# Patient Record
Sex: Male | Born: 1960 | Race: White | Hispanic: No | Marital: Married | Smoking: Never smoker
Health system: Southern US, Community
[De-identification: ages and names within clinical notes are randomized; demographics above are authoritative.]

## PROBLEM LIST (undated history)

## (undated) DIAGNOSIS — I10 Essential (primary) hypertension: Secondary | ICD-10-CM

## (undated) DIAGNOSIS — R0609 Other forms of dyspnea: Secondary | ICD-10-CM

## (undated) DIAGNOSIS — K429 Umbilical hernia without obstruction or gangrene: Secondary | ICD-10-CM

## (undated) DIAGNOSIS — R0600 Dyspnea, unspecified: Secondary | ICD-10-CM

## (undated) DIAGNOSIS — D759 Disease of blood and blood-forming organs, unspecified: Secondary | ICD-10-CM

## (undated) DIAGNOSIS — J45909 Unspecified asthma, uncomplicated: Secondary | ICD-10-CM

## (undated) DIAGNOSIS — R51 Headache: Secondary | ICD-10-CM

## (undated) DIAGNOSIS — E291 Testicular hypofunction: Secondary | ICD-10-CM

## (undated) DIAGNOSIS — I38 Endocarditis, valve unspecified: Secondary | ICD-10-CM

## (undated) DIAGNOSIS — R609 Edema, unspecified: Secondary | ICD-10-CM

## (undated) DIAGNOSIS — C799 Secondary malignant neoplasm of unspecified site: Secondary | ICD-10-CM

## (undated) DIAGNOSIS — K219 Gastro-esophageal reflux disease without esophagitis: Secondary | ICD-10-CM

## (undated) DIAGNOSIS — E119 Type 2 diabetes mellitus without complications: Secondary | ICD-10-CM

## (undated) DIAGNOSIS — G4730 Sleep apnea, unspecified: Secondary | ICD-10-CM

## (undated) DIAGNOSIS — R600 Localized edema: Secondary | ICD-10-CM

## (undated) DIAGNOSIS — R519 Headache, unspecified: Secondary | ICD-10-CM

## (undated) HISTORY — PX: KNEE SURGERY: SHX244

## (undated) HISTORY — DX: Essential (primary) hypertension: I10

## (undated) HISTORY — DX: Type 2 diabetes mellitus without complications: E11.9

## (undated) HISTORY — PX: TONSILLECTOMY: SUR1361

---

## 2004-08-28 DIAGNOSIS — C799 Secondary malignant neoplasm of unspecified site: Secondary | ICD-10-CM

## 2004-08-28 HISTORY — DX: Secondary malignant neoplasm of unspecified site: C79.9

## 2005-04-02 ENCOUNTER — Emergency Department: Payer: Self-pay | Admitting: Emergency Medicine

## 2006-04-06 ENCOUNTER — Emergency Department: Payer: Self-pay | Admitting: Emergency Medicine

## 2007-04-07 ENCOUNTER — Ambulatory Visit: Payer: Self-pay | Admitting: Internal Medicine

## 2008-04-08 ENCOUNTER — Ambulatory Visit: Payer: Self-pay | Admitting: Family Medicine

## 2008-12-17 ENCOUNTER — Emergency Department: Payer: Self-pay | Admitting: Emergency Medicine

## 2009-08-28 HISTORY — PX: COLONOSCOPY: SHX174

## 2010-06-01 ENCOUNTER — Ambulatory Visit: Payer: Self-pay | Admitting: Cardiovascular Disease

## 2010-07-29 ENCOUNTER — Emergency Department: Payer: Self-pay | Admitting: Unknown Physician Specialty

## 2010-10-10 ENCOUNTER — Observation Stay: Payer: Self-pay | Admitting: Internal Medicine

## 2012-03-22 ENCOUNTER — Emergency Department: Payer: Self-pay | Admitting: Emergency Medicine

## 2012-03-22 LAB — BASIC METABOLIC PANEL
Anion Gap: 7 (ref 7–16)
Calcium, Total: 9.1 mg/dL (ref 8.5–10.1)
Chloride: 103 mmol/L (ref 98–107)
Co2: 31 mmol/L (ref 21–32)
EGFR (Non-African Amer.): 52 — ABNORMAL LOW
Glucose: 111 mg/dL — ABNORMAL HIGH (ref 65–99)
Potassium: 3.8 mmol/L (ref 3.5–5.1)
Sodium: 141 mmol/L (ref 136–145)

## 2012-03-22 LAB — CBC
HGB: 15.4 g/dL (ref 13.0–18.0)
MCH: 29.1 pg (ref 26.0–34.0)
MCHC: 33.4 g/dL (ref 32.0–36.0)
MCV: 87 fL (ref 80–100)
RDW: 13.4 % (ref 11.5–14.5)

## 2012-03-22 LAB — CK TOTAL AND CKMB (NOT AT ARMC): CK-MB: 1 ng/mL (ref 0.5–3.6)

## 2012-03-23 LAB — CK TOTAL AND CKMB (NOT AT ARMC)
CK, Total: 46 U/L (ref 35–232)
CK-MB: 0.9 ng/mL (ref 0.5–3.6)

## 2014-05-08 ENCOUNTER — Emergency Department: Payer: Self-pay | Admitting: Emergency Medicine

## 2014-05-08 LAB — CBC WITH DIFFERENTIAL/PLATELET
Basophil #: 0 10*3/uL (ref 0.0–0.1)
Basophil %: 0.4 %
Eosinophil #: 0.2 10*3/uL (ref 0.0–0.7)
Eosinophil %: 2.1 %
HCT: 51.1 % (ref 40.0–52.0)
HGB: 16.2 g/dL (ref 13.0–18.0)
LYMPHS ABS: 0.7 10*3/uL — AB (ref 1.0–3.6)
Lymphocyte %: 9.6 %
MCH: 27.7 pg (ref 26.0–34.0)
MCHC: 31.8 g/dL — AB (ref 32.0–36.0)
MCV: 87 fL (ref 80–100)
Monocyte #: 0.4 x10 3/mm (ref 0.2–1.0)
Monocyte %: 5.5 %
Neutrophil #: 6.3 10*3/uL (ref 1.4–6.5)
Neutrophil %: 82.4 %
Platelet: 233 10*3/uL (ref 150–440)
RBC: 5.85 10*6/uL (ref 4.40–5.90)
RDW: 13.6 % (ref 11.5–14.5)
WBC: 7.6 10*3/uL (ref 3.8–10.6)

## 2014-05-08 LAB — COMPREHENSIVE METABOLIC PANEL
Albumin: 3.6 g/dL (ref 3.4–5.0)
Alkaline Phosphatase: 44 U/L — ABNORMAL LOW
Anion Gap: 8 (ref 7–16)
BILIRUBIN TOTAL: 0.8 mg/dL (ref 0.2–1.0)
BUN: 15 mg/dL (ref 7–18)
CALCIUM: 8.5 mg/dL (ref 8.5–10.1)
CHLORIDE: 105 mmol/L (ref 98–107)
Co2: 25 mmol/L (ref 21–32)
Creatinine: 1.36 mg/dL — ABNORMAL HIGH (ref 0.60–1.30)
GFR CALC NON AF AMER: 59 — AB
GLUCOSE: 125 mg/dL — AB (ref 65–99)
OSMOLALITY: 278 (ref 275–301)
Potassium: 3.8 mmol/L (ref 3.5–5.1)
SGOT(AST): 28 U/L (ref 15–37)
SGPT (ALT): 31 U/L
SODIUM: 138 mmol/L (ref 136–145)
Total Protein: 7.4 g/dL (ref 6.4–8.2)

## 2014-05-08 LAB — URINALYSIS, COMPLETE
BLOOD: NEGATIVE
Bacteria: NONE SEEN
Bilirubin,UR: NEGATIVE
GLUCOSE, UR: NEGATIVE mg/dL (ref 0–75)
KETONE: NEGATIVE
Leukocyte Esterase: NEGATIVE
Nitrite: NEGATIVE
PH: 5 (ref 4.5–8.0)
Protein: NEGATIVE
RBC,UR: <1 /HPF (ref 0–5)
SPECIFIC GRAVITY: 1.012 (ref 1.003–1.030)

## 2014-05-08 LAB — LIPASE, BLOOD: LIPASE: 120 U/L (ref 73–393)

## 2015-02-24 ENCOUNTER — Encounter: Payer: Self-pay | Admitting: Urgent Care

## 2015-02-24 ENCOUNTER — Emergency Department
Admission: EM | Admit: 2015-02-24 | Discharge: 2015-02-25 | Disposition: A | Discharge status: Home / Self Care | Payer: BLUE CROSS/BLUE SHIELD | Attending: Emergency Medicine | Admitting: Emergency Medicine

## 2015-02-24 DIAGNOSIS — R509 Fever, unspecified: Secondary | ICD-10-CM | POA: Diagnosis not present

## 2015-02-24 DIAGNOSIS — E860 Dehydration: Secondary | ICD-10-CM | POA: Insufficient documentation

## 2015-02-24 DIAGNOSIS — R51 Headache: Secondary | ICD-10-CM | POA: Insufficient documentation

## 2015-02-24 DIAGNOSIS — R109 Unspecified abdominal pain: Secondary | ICD-10-CM | POA: Diagnosis not present

## 2015-02-24 DIAGNOSIS — R197 Diarrhea, unspecified: Secondary | ICD-10-CM

## 2015-02-24 HISTORY — DX: Edema, unspecified: R60.9

## 2015-02-24 HISTORY — DX: Secondary malignant neoplasm of unspecified site: C79.9

## 2015-02-24 HISTORY — DX: Umbilical hernia without obstruction or gangrene: K42.9

## 2015-02-24 HISTORY — DX: Localized edema: R60.0

## 2015-02-24 HISTORY — DX: Testicular hypofunction: E29.1

## 2015-02-24 HISTORY — DX: Dyspnea, unspecified: R06.00

## 2015-02-24 HISTORY — DX: Other forms of dyspnea: R06.09

## 2015-02-24 HISTORY — DX: Endocarditis, valve unspecified: I38

## 2015-02-24 LAB — COMPREHENSIVE METABOLIC PANEL
ALT: 19 U/L (ref 17–63)
ANION GAP: 9 (ref 5–15)
AST: 17 U/L (ref 15–41)
Albumin: 4.2 g/dL (ref 3.5–5.0)
Alkaline Phosphatase: 46 U/L (ref 38–126)
BILIRUBIN TOTAL: 0.5 mg/dL (ref 0.3–1.2)
BUN: 14 mg/dL (ref 6–20)
CHLORIDE: 100 mmol/L — AB (ref 101–111)
CO2: 27 mmol/L (ref 22–32)
CREATININE: 1.05 mg/dL (ref 0.61–1.24)
Calcium: 9.3 mg/dL (ref 8.9–10.3)
GLUCOSE: 104 mg/dL — AB (ref 65–99)
Potassium: 4 mmol/L (ref 3.5–5.1)
Sodium: 136 mmol/L (ref 135–145)
Total Protein: 7.4 g/dL (ref 6.5–8.1)

## 2015-02-24 LAB — CBC WITH DIFFERENTIAL/PLATELET
BASOS ABS: 0 10*3/uL (ref 0–0.1)
Basophils Relative: 0 %
Eosinophils Absolute: 0.2 10*3/uL (ref 0–0.7)
Eosinophils Relative: 1 %
HEMATOCRIT: 53.8 % — AB (ref 40.0–52.0)
Hemoglobin: 17.6 g/dL (ref 13.0–18.0)
LYMPHS PCT: 5 %
Lymphs Abs: 0.6 10*3/uL — ABNORMAL LOW (ref 1.0–3.6)
MCH: 28.1 pg (ref 26.0–34.0)
MCHC: 32.7 g/dL (ref 32.0–36.0)
MCV: 86.1 fL (ref 80.0–100.0)
Monocytes Absolute: 0.5 10*3/uL (ref 0.2–1.0)
Monocytes Relative: 4 %
NEUTROS ABS: 10.5 10*3/uL — AB (ref 1.4–6.5)
Neutrophils Relative %: 90 %
PLATELETS: 236 10*3/uL (ref 150–440)
RBC: 6.24 MIL/uL — ABNORMAL HIGH (ref 4.40–5.90)
RDW: 13.6 % (ref 11.5–14.5)
WBC: 11.7 10*3/uL — ABNORMAL HIGH (ref 3.8–10.6)

## 2015-02-24 LAB — LIPASE, BLOOD: LIPASE: 54 U/L — AB (ref 22–51)

## 2015-02-24 MED ORDER — SODIUM CHLORIDE 0.9 % IV BOLUS (SEPSIS): 1000.0000 mL | Freq: Once | INTRAVENOUS | Status: DC

## 2015-02-24 MED ORDER — SODIUM CHLORIDE 0.9 % IV BOLUS (SEPSIS)
1000.0000 mL | Freq: Once | INTRAVENOUS | Status: AC
  Administered 2015-02-24: 1000 mL via INTRAVENOUS

## 2015-02-24 MED ORDER — IOHEXOL 240 MG/ML SOLN
25.0000 mL | Freq: Once | INTRAMUSCULAR | Status: AC | PRN
  Administered 2015-02-25: 25 mL via ORAL

## 2015-02-24 NOTE — ED Notes (Signed)
Patient presents with c/o a non-specific headache, diarrhea, and a fever with chills that began when patient woke up this morning. Reporting 10 episodes of diarrhea.

## 2015-02-24 NOTE — ED Provider Notes (Signed)
[facility] Emergency Department Provider Note  ____________________________________________  Time seen: 11:15 PM  I have reviewed the triage vital signs and the nursing notes.   HISTORY  Chief Complaint Fever; Diarrhea; and Headache      HPI Kevonta Phariss is a 54 y.o. male presents with nonbloody diarrhea"all day". Subjective fevers chills and generalized abdominal discomfort. Patient states "I figured I ate something bad". Patient admits to cessation of the diarrhea since arrival to the emergency department status post taking half a tablet of Imodium.     Past Medical History  Diagnosis Date  . Peripheral edema   . Leaky heart valve   . Dyspnea on exertion   . Umbilical hernia   . 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 Hypogonadism male     There are no active problems to display for this patient.   Past Surgical History  Procedure Laterality Date  . Tonsillectomy    . Knee surgery      No current outpatient prescriptions on file.  Allergies Biaxin and Ibuprofen  No family history on file.  Social History History  Substance Use Topics  . Smoking status: Never Smoker   . Smokeless tobacco: Current User  . Alcohol Use: Yes    Review of Systems  Constitutional: Negative for fever. Eyes: Negative for visual changes. ENT: Negative for sore throat. Cardiovascular: Negative for chest pain. Respiratory: Negative for shortness of breath. Gastrointestinal: Positive for abdominal pain  and diarrhea. Genitourinary: Negative for dysuria. Musculoskeletal: Negative for back pain. Skin: Negative for rash. Neurological: Negative for headaches, focal weakness or numbness.   10-point ROS otherwise negative.  ____________________________________________   PHYSICAL EXAM:  VITAL SIGNS: ED Triage Vitals  Enc Vitals Group     BP 02/24/15 2012 138/84 mmHg     Pulse Rate 02/24/15 2012 110     Resp 02/24/15 2012  20     Temp 02/24/15 2012 99.9 F (37.7 C)     Temp Source 02/24/15 2012 Oral     SpO2 02/24/15 2012 92 %     Weight 02/24/15 2012 293 lb (132.904 kg)     Height 02/24/15 2012 6' (1.829 m)     Head Cir --      Peak Flow --      Pain Score 02/24/15 2013 8     Pain Loc --      Pain Edu? --      Excl. in [place]? --      Constitutional: Alert and oriented. Well appearing and in no distress. Eyes: Conjunctivae are normal. PERRL. Normal extraocular movements. ENT   Head: Normocephalic and atraumatic.   Nose: No congestion/rhinnorhea.   Mouth/Throat: Mucous membranes are moist.   Neck: No stridor. Cardiovascular: Normal rate, regular rhythm. Normal and symmetric distal pulses are present in all extremities. No murmurs, rubs, or gallops. Respiratory: Normal respiratory effort without tachypnea nor retractions. Breath sounds are clear and equal bilaterally. No wheezes/rales/rhonchi. Gastrointestinal: Central abdominal pain with palpation. No distention. There is no CVA tenderness. Genitourinary: deferred Musculoskeletal: Nontender with normal range of motion in all extremities. No joint effusions.  No lower extremity tenderness nor edema. Neurologic:  Normal speech and language. No gross focal neurologic deficits are appreciated. Speech is normal.  Skin:  Skin is warm, dry and intact. No rash noted. Psychiatric: Mood and affect are normal. Speech and behavior are normal. Patient exhibits appropriate insight and judgment.  ____________________________________________    LABS (pertinent  positives/negatives)  Labs Reviewed  CBC WITH DIFFERENTIAL/PLATELET - Abnormal; Notable for the following:    WBC 11.7 (*)    RBC 6.24 (*)    HCT 53.8 (*)    Neutro Abs 10.5 (*)    Lymphs Abs 0.6 (*)    All other components within normal limits  COMPREHENSIVE METABOLIC PANEL - Abnormal; Notable for the following:    Chloride 100 (*)    Glucose, Bld 104 (*)    All other components within  normal limits  LIPASE, BLOOD - Abnormal; Notable for the following:    Lipase 54 (*)    All other components within normal limits       RADIOLOGY  CT scan of the abdomen and pelvis revealed  IMPRESSION: No acute abnormality noted.    INITIAL IMPRESSION / ASSESSMENT AND PLAN / ED COURSE  Pertinent labs & imaging results that were available during my care of the patient were reviewed by me and considered in my medical decision making (see chart for details).  History of physical exam consistent with possible gastroenteritis as such patient received 2 L IV normal saline.  ____________________________________________   FINAL CLINICAL IMPRESSION(S) / ED DIAGNOSES  Final diagnoses:  Diarrhea  Dehydration      Gregor Hams, MD 02/26/15 [phone]

## 2015-02-24 NOTE — ED Notes (Signed)
In to advise patient I would be with him in just a few minutes as I have two discharges pending. Patient being registered currently.

## 2015-02-24 NOTE — ED Notes (Signed)
Patient started drinking contrast dye. CT tech in room with patient and family member explaining procedural items.

## 2015-02-25 ENCOUNTER — Emergency Department: Payer: BLUE CROSS/BLUE SHIELD

## 2015-02-25 ENCOUNTER — Encounter: Payer: Self-pay | Admitting: Radiology

## 2015-02-25 MED ORDER — IOHEXOL 300 MG/ML  SOLN
100.0000 mL | Freq: Once | INTRAMUSCULAR | Status: AC | PRN
  Administered 2015-02-25: 100 mL via INTRAVENOUS

## 2015-02-25 NOTE — ED Notes (Signed)
Patient requesting urinal, given. Will continue to monitor. Denies other needs at this time.

## 2015-02-25 NOTE — ED Notes (Signed)
Patient to CT.

## 2015-02-25 NOTE — ED Notes (Signed)
Patient returned from [place]. States he is hungry and ready to go home.

## 2015-02-25 NOTE — Discharge Instructions (Signed)

## 2015-04-13 ENCOUNTER — Encounter: Payer: Self-pay | Admitting: *Deleted

## 2015-04-22 ENCOUNTER — Encounter: Payer: Self-pay | Admitting: General Surgery

## 2015-04-22 ENCOUNTER — Ambulatory Visit (INDEPENDENT_AMBULATORY_CARE_PROVIDER_SITE_OTHER): Payer: BLUE CROSS/BLUE SHIELD | Admitting: General Surgery

## 2015-04-22 VITALS — BP 118/84 | HR 70 | Resp 12 | Ht 72.0 in | Wt 294.0 lb

## 2015-04-22 DIAGNOSIS — K429 Umbilical hernia without obstruction or gangrene: Secondary | ICD-10-CM | POA: Diagnosis not present

## 2015-04-22 DIAGNOSIS — M6208 Separation of muscle (nontraumatic), other site: Secondary | ICD-10-CM

## 2015-04-22 NOTE — Patient Instructions (Addendum)
The patient is aware to call back for any questions or concerns.  Hernia A hernia occurs when an internal organ pushes out through a weak spot in the abdominal wall. Hernias most commonly occur in the groin and around the navel. Hernias often can be pushed back into place (reduced). Most hernias tend to get worse over time. Some abdominal hernias can get stuck in the opening (irreducible or incarcerated hernia) and cannot be reduced. An irreducible abdominal hernia which is tightly squeezed into the opening is at risk for impaired blood supply (strangulated hernia). A strangulated hernia is a medical emergency. Because of the risk for an irreducible or strangulated hernia, surgery may be recommended to repair a hernia. CAUSES   Heavy lifting.  Prolonged coughing.  Straining to have a bowel movement.  A cut (incision) made during an abdominal surgery. HOME CARE INSTRUCTIONS   Bed rest is not required. You may continue your normal activities.  Avoid lifting more than 10 pounds (4.5 kg) or straining.  Cough gently. If you are a smoker it is best to stop. Even the best hernia repair can break down with the continual strain of coughing. Even if you do not have your hernia repaired, a cough will continue to aggravate the problem.  Do not wear anything tight over your hernia. Do not try to keep it in with an outside bandage or truss. These can damage abdominal contents if they are trapped within the hernia sac.  Eat a normal diet.  Avoid constipation. Straining over long periods of time will increase hernia size and encourage breakdown of repairs. If you cannot do this with diet alone, stool softeners may be used. SEEK IMMEDIATE MEDICAL CARE IF:   You have a fever.  You develop increasing abdominal pain.  You feel nauseous or vomit.  Your hernia is stuck outside the abdomen, looks discolored, feels hard, or is tender.  You have any changes in your bowel habits or in the hernia that are  unusual for you.  You have increased pain or swelling around the hernia.  You cannot push the hernia back in place by applying gentle pressure while lying down. MAKE SURE YOU:   Understand these instructions.  Will watch your condition.  Will get help right away if you are not doing well or get worse. Document Released: 08/14/2005 Document Revised: 11/06/2011 Document Reviewed: 04/02/2008 [facility] Patient Information 2015 [place], [place]. This information is not intended to replace advice given to you by your health care provider. Make sure you discuss any questions you have with your health care provider.  Patient's surgery has been scheduled for 05-06-15 at [facility].

## 2015-04-22 NOTE — Progress Notes (Signed)
Patient ID: Patrick Burns, male   DOB: 1961/05/04, 54 y.o.   MRN: [id number]  Chief Complaint  Patient presents with  . Hernia    HPI Patrick Burns is a 54 y.o. male.  Here today for evaluation of possible umbilical hernia. States he has noticed a bulge at his belly button for at least a year. Last week it did have some pain and its worse with sneezing.   HPI  Past Medical History  Diagnosis Date  . Peripheral edema   . Leaky heart valve   . Dyspnea on exertion   . Umbilical hernia   . Hypogonadism male   . Metastatic cancer 2006    Rectum --> prostate --> bladder/chemo and radiation/ [facility]/squamous cell carcinoma anal rectum area    Past Surgical History  Procedure Laterality Date  . Tonsillectomy    . Knee surgery    . Colonoscopy  2011    [facility]    Family History  Problem Relation Age of Onset  . Diabetes Father     Social History Social History  Substance Use Topics  . Smoking status: Current Every Day Smoker -- 35 years  . Smokeless tobacco: Current User    Types: Snuff  . Alcohol Use: No    Allergies  Allergen Reactions  . Biaxin [Clarithromycin]   . Ibuprofen     Current Outpatient Prescriptions  Medication Sig Dispense Refill  . albuterol (VENTOLIN HFA) 108 (90 BASE) MCG/ACT inhaler Inhale 2 puffs into the lungs every 4 (four) hours as needed.     . Aspirin-Acetaminophen-Caffeine (EXCEDRIN MIGRAINE PO) Take by mouth as needed.    . cetirizine (ZYRTEC) 10 MG tablet Take 10 mg by mouth.    . fenofibrate (TRICOR) 145 MG tablet Take 145 mg by mouth.    . fluticasone (FLONASE) 50 MCG/ACT nasal spray 1 SPRAY PER NARES DAILY  5  . glucosamine-chondroitin 500-400 MG tablet Take 1 tablet by mouth 2 (two) times daily.    . Multiple Vitamin (MULTI-VITAMINS) TABS Take by mouth.    . pantoprazole (PROTONIX) 40 MG tablet Take 40 mg by mouth.    . sildenafil (VIAGRA) 100 MG tablet Take 100 mg by mouth.    . vitamin E (VITAMIN E) 400 UNIT capsule Take 400  Units by mouth daily.     No current facility-administered medications for this visit.    Review of Systems Review of Systems  Constitutional: Negative.   Respiratory: Negative.   Cardiovascular: Negative.     Blood pressure 118/84, pulse 70, resp. rate 12, height 6' (1.829 m), weight 294 lb (133.358 kg).  Physical Exam Physical Exam  Constitutional: He is oriented to person, place, and time. He appears well-developed and well-nourished.  HENT:  Mouth/Throat: Oropharynx is clear and moist.  Eyes: Conjunctivae are normal. No scleral icterus.  Neck: Neck supple.  Cardiovascular: Normal rate, regular rhythm and normal heart sounds.   Pulmonary/Chest: Effort normal and breath sounds normal.  Abdominal: Soft. Normal appearance. There is tenderness. A hernia is present. Hernia confirmed negative in the right inguinal area and confirmed negative in the left inguinal area.  Diastasis recti present. Reducible mildly tender 3-4 cm umbilical hernia.  Lymphadenopathy:    He has no cervical adenopathy.  Neurological: He is alert and oriented to person, place, and time.  Skin: Skin is warm and dry.  Psychiatric: His behavior is normal.    Data Reviewed Office notes.  Assessment    Umbilical hernia, symptomatic. Diastasis recti.  Plan    Hernia precautions and incarceration were discussed with the patient. If they develop symptoms of an incarcerated hernia, they were encouraged to seek prompt medical attention.  I have recommended repair of the hernia using mesh on an outpatient basis in the near future. The risk of infection was reviewed. The role of prosthetic mesh to minimize the risk of recurrence was reviewed.     Patient's surgery has been scheduled for 05-06-15 at [facility].   PCP:  Park Meo 04/22/2015, 1:46 PM

## 2015-04-30 ENCOUNTER — Encounter: Payer: Self-pay | Admitting: *Deleted

## 2015-04-30 ENCOUNTER — Other Ambulatory Visit: Payer: BLUE CROSS/BLUE SHIELD

## 2015-04-30 DIAGNOSIS — R0602 Shortness of breath: Secondary | ICD-10-CM

## 2015-04-30 NOTE — Pre-Procedure Instructions (Signed)
CALLED CAROLYN AT DR Jacalyn Lefevre OFFICE TO INFORM THEM ABOUT PT STATING DURING PHONE INTERVIEW THAT HIS "BLOOD WAS THICK" AND THAT HE WAS HAVING TO SEE A HEMTOLOGIST LATER THIS MONTH. NOTED IN CARE EVERYWHERE THAT HIS HEMATOCRIT WAS ELEVATED IN THE [facility] SYSTEM AND HIS UROLOGIST AT [facility] WAS THE ONE REFERRING HIM TO HEMATOLOGY.

## 2015-04-30 NOTE — Patient Instructions (Signed)
  Your procedure is scheduled on: 05-06-15 Report to [place] To find out your arrival time please call [phone] between 1PM - 3PM on 05-05-15 [facility])  Remember: Instructions that are not followed completely may result in serious medical risk, up to and including death, or upon the discretion of your surgeon and anesthesiologist your surgery may need to be rescheduled.    _X___ 1. Do not eat food or drink liquids after midnight. No gum chewing or hard candies.     _X___ 2. No Alcohol for 24 hours before or after surgery.   ____ 3. Bring all medications with you on the day of surgery if instructed.    _X___ 4. Notify your doctor if there is any change in your medical condition     (cold, fever, infections).     Do not wear jewelry, make-up, hairpins, clips or nail polish.  Do not wear lotions, powders, or perfumes. You may wear deodorant.  Do not shave 48 hours prior to surgery. Men may shave face and neck.  Do not bring valuables to the hospital.    [facility] is not responsible for any belongings or valuables.               Contacts, dentures or bridgework may not be worn into surgery.  Leave your suitcase in the car. After surgery it may be brought to your room.  For patients admitted to the hospital, discharge time is determined by your  treatment team.   Patients discharged the day of surgery will not be allowed to drive home.   Please read over the following fact sheets that you were given:      __X__ Take these medicines the morning of surgery with A SIP OF WATER:    1. FENOFIBRATE  2. PROTONIX  3. TAKE AN EXTRA PROTONIX ON Wednesday NIGHT  4.  5.  6.  ____ Fleet Enema (as directed)   _X___ Use CHG Soap as directed  _X___ Use inhalers on the day of surgery-USE ALBUTEROL AND [place]  ____ Stop metformin 2 days prior to surgery    ____ Take 1/2 of usual insulin dose the night before surgery and none on the morning of  surgery.   __X__ Stop Coumadin/Plavix/aspirin-STOP EXCEDRIN MIGRAINE NOW  ____ Stop Anti-inflammatories-NO NSAIDS OR ASPIRIN PRODUCTS-TYLENOL OK   __X__ Stop supplements until after surgery-STOP GLUCOSAMINE-CHONDROITIN, VITAMIN E AND CO-Q 10 NOW  ____ Bring C-Pap to the hospital.

## 2015-05-04 ENCOUNTER — Encounter
Admission: RE | Admit: 2015-05-04 | Discharge: 2015-05-04 | Disposition: A | Discharge status: Home / Self Care | Payer: BLUE CROSS/BLUE SHIELD | Source: Ambulatory Visit | Attending: Anesthesiology | Admitting: Anesthesiology

## 2015-05-04 DIAGNOSIS — Z9889 Other specified postprocedural states: Secondary | ICD-10-CM | POA: Diagnosis not present

## 2015-05-04 DIAGNOSIS — Z886 Allergy status to analgesic agent status: Secondary | ICD-10-CM | POA: Diagnosis not present

## 2015-05-04 DIAGNOSIS — Z79899 Other long term (current) drug therapy: Secondary | ICD-10-CM | POA: Diagnosis not present

## 2015-05-04 DIAGNOSIS — E230 Hypopituitarism: Secondary | ICD-10-CM | POA: Diagnosis not present

## 2015-05-04 DIAGNOSIS — Z833 Family history of diabetes mellitus: Secondary | ICD-10-CM | POA: Diagnosis not present

## 2015-05-04 DIAGNOSIS — Z7982 Long term (current) use of aspirin: Secondary | ICD-10-CM | POA: Diagnosis not present

## 2015-05-04 DIAGNOSIS — Z7951 Long term (current) use of inhaled steroids: Secondary | ICD-10-CM | POA: Diagnosis not present

## 2015-05-04 DIAGNOSIS — K429 Umbilical hernia without obstruction or gangrene: Secondary | ICD-10-CM | POA: Diagnosis not present

## 2015-05-04 DIAGNOSIS — Z881 Allergy status to other antibiotic agents status: Secondary | ICD-10-CM | POA: Diagnosis not present

## 2015-05-04 DIAGNOSIS — F17290 Nicotine dependence, other tobacco product, uncomplicated: Secondary | ICD-10-CM | POA: Diagnosis not present

## 2015-05-04 NOTE — Pre-Procedure Instructions (Signed)
CALLED DR Kayleen Memos AND INFORMED HIM OF [facility] WANTING PT TO SEE HEMATOLOGY ABOUT ELEVATED HGB/HCT (17.8/53.7) AND ALSO OF PT C/O SOB WITH EXERTION AND STATING THAT HE HAS A LEAKY VALVE-EKG DONE AND WAS NORMAL IN OFFICE TODAY- DR CARROLL SAID HE WANTS ME TO SEND THIS TO DR TEJAN-SIE-CALLED JESSICA AT DR TEJAN-SIES OFFICE AND NOTIFIED HER OF THIS. FAXED THIS INFO ALONG WITH EKG DONE TO OFFICE

## 2015-05-05 NOTE — Pre-Procedure Instructions (Signed)
DR TEJAN-SIE LOOKED OVER LABS AND EKG-HE SENT NOTE BACK THAT PT IS LOW RISK FOR SURGERY-PUT ON CHART AND NOTIFIED MELANIE AT DR [facility] OFFICE OF THIS

## 2015-05-06 ENCOUNTER — Ambulatory Visit
Admission: RE | Admit: 2015-05-06 | Discharge: 2015-05-06 | Disposition: A | Discharge status: Home / Self Care | Payer: BLUE CROSS/BLUE SHIELD | Source: Ambulatory Visit | Attending: General Surgery | Admitting: General Surgery

## 2015-05-06 ENCOUNTER — Ambulatory Visit: Payer: BLUE CROSS/BLUE SHIELD | Admitting: Anesthesiology

## 2015-05-06 ENCOUNTER — Encounter
Admission: RE | Disposition: A | Discharge status: Home / Self Care | Payer: Self-pay | Source: Ambulatory Visit | Attending: General Surgery

## 2015-05-06 ENCOUNTER — Encounter: Payer: Self-pay | Admitting: *Deleted

## 2015-05-06 DIAGNOSIS — F17290 Nicotine dependence, other tobacco product, uncomplicated: Secondary | ICD-10-CM | POA: Insufficient documentation

## 2015-05-06 DIAGNOSIS — Z881 Allergy status to other antibiotic agents status: Secondary | ICD-10-CM | POA: Insufficient documentation

## 2015-05-06 DIAGNOSIS — Z9889 Other specified postprocedural states: Secondary | ICD-10-CM | POA: Insufficient documentation

## 2015-05-06 DIAGNOSIS — K429 Umbilical hernia without obstruction or gangrene: Secondary | ICD-10-CM

## 2015-05-06 DIAGNOSIS — Z7951 Long term (current) use of inhaled steroids: Secondary | ICD-10-CM | POA: Insufficient documentation

## 2015-05-06 DIAGNOSIS — Z79899 Other long term (current) drug therapy: Secondary | ICD-10-CM | POA: Insufficient documentation

## 2015-05-06 DIAGNOSIS — E230 Hypopituitarism: Secondary | ICD-10-CM | POA: Insufficient documentation

## 2015-05-06 DIAGNOSIS — Z833 Family history of diabetes mellitus: Secondary | ICD-10-CM | POA: Insufficient documentation

## 2015-05-06 DIAGNOSIS — Z886 Allergy status to analgesic agent status: Secondary | ICD-10-CM | POA: Insufficient documentation

## 2015-05-06 DIAGNOSIS — Z7982 Long term (current) use of aspirin: Secondary | ICD-10-CM | POA: Insufficient documentation

## 2015-05-06 HISTORY — DX: Sleep apnea, unspecified: G47.30

## 2015-05-06 HISTORY — DX: Unspecified asthma, uncomplicated: J45.909

## 2015-05-06 HISTORY — DX: Headache: R51

## 2015-05-06 HISTORY — DX: Gastro-esophageal reflux disease without esophagitis: K21.9

## 2015-05-06 HISTORY — DX: Disease of blood and blood-forming organs, unspecified: D75.9

## 2015-05-06 HISTORY — DX: Headache, unspecified: R51.9

## 2015-05-06 HISTORY — PX: UMBILICAL HERNIA REPAIR: SHX196

## 2015-05-06 SURGERY — REPAIR, HERNIA, UMBILICAL, ADULT
Anesthesia: General | Wound class: Clean

## 2015-05-06 MED ORDER — NEOSTIGMINE METHYLSULFATE 10 MG/10ML IV SOLN
INTRAVENOUS | Status: DC | PRN
  Administered 2015-05-06: 3 mg via INTRAVENOUS

## 2015-05-06 MED ORDER — ONDANSETRON HCL 4 MG/2ML IJ SOLN: 4.0000 mg | Freq: Once | INTRAMUSCULAR | Status: DC | PRN

## 2015-05-06 MED ORDER — OXYCODONE-ACETAMINOPHEN 5-325 MG PO TABS
1.0000 | ORAL_TABLET | ORAL | Status: DC | PRN
Start: 2015-05-06 — End: 2015-05-06
  Administered 2015-05-06: 1 via ORAL

## 2015-05-06 MED ORDER — OXYCODONE-ACETAMINOPHEN 5-325 MG PO TABS
ORAL_TABLET | ORAL | Status: AC
  Filled 2015-05-06: qty 1

## 2015-05-06 MED ORDER — PROPOFOL 10 MG/ML IV BOLUS
INTRAVENOUS | Status: DC | PRN
  Administered 2015-05-06: 200 mg via INTRAVENOUS

## 2015-05-06 MED ORDER — ROCURONIUM BROMIDE 100 MG/10ML IV SOLN
INTRAVENOUS | Status: DC | PRN
  Administered 2015-05-06: 20 mg via INTRAVENOUS

## 2015-05-06 MED ORDER — HYDROMORPHONE HCL 1 MG/ML IJ SOLN
INTRAMUSCULAR | Status: DC | PRN
  Administered 2015-05-06 (×2): .6 mg via INTRAVENOUS

## 2015-05-06 MED ORDER — LIDOCAINE HCL (CARDIAC) 20 MG/ML IV SOLN
INTRAVENOUS | Status: DC | PRN
  Administered 2015-05-06: 100 mg via INTRAVENOUS

## 2015-05-06 MED ORDER — OXYCODONE-ACETAMINOPHEN 5-325 MG PO TABS: 1.0000 | ORAL_TABLET | ORAL | Status: DC | PRN

## 2015-05-06 MED ORDER — LIDOCAINE HCL (PF) 1 % IJ SOLN
INTRAMUSCULAR | Status: AC
  Filled 2015-05-06: qty 30

## 2015-05-06 MED ORDER — FENTANYL CITRATE (PF) 100 MCG/2ML IJ SOLN
INTRAMUSCULAR | Status: DC | PRN
  Administered 2015-05-06 (×2): 50 ug via INTRAVENOUS

## 2015-05-06 MED ORDER — ACETAMINOPHEN 10 MG/ML IV SOLN
INTRAVENOUS | Status: AC
  Filled 2015-05-06: qty 100

## 2015-05-06 MED ORDER — CHLORHEXIDINE GLUCONATE 4 % EX LIQD: 1.0000 "application " | Freq: Once | CUTANEOUS | Status: DC

## 2015-05-06 MED ORDER — DEXTROSE 5 % IV SOLN
3.0000 g | INTRAVENOUS | Status: AC
  Administered 2015-05-06: 3 g via INTRAVENOUS
  Filled 2015-05-06: qty 3000

## 2015-05-06 MED ORDER — BUPIVACAINE HCL (PF) 0.5 % IJ SOLN
INTRAMUSCULAR | Status: DC | PRN
  Administered 2015-05-06: 10 mL

## 2015-05-06 MED ORDER — IPRATROPIUM-ALBUTEROL 0.5-2.5 (3) MG/3ML IN SOLN
RESPIRATORY_TRACT | Status: AC
  Filled 2015-05-06: qty 3

## 2015-05-06 MED ORDER — LIDOCAINE HCL (PF) 1 % IJ SOLN
INTRAMUSCULAR | Status: DC | PRN
  Administered 2015-05-06: 10 mL via SUBCUTANEOUS

## 2015-05-06 MED ORDER — BUPIVACAINE HCL (PF) 0.5 % IJ SOLN
INTRAMUSCULAR | Status: AC
  Filled 2015-05-06: qty 30

## 2015-05-06 MED ORDER — LACTATED RINGERS IV SOLN
INTRAVENOUS | Status: DC
  Administered 2015-05-06 (×2): via INTRAVENOUS

## 2015-05-06 MED ORDER — KETOROLAC TROMETHAMINE 15 MG/ML IJ SOLN
INTRAMUSCULAR | Status: DC | PRN
  Administered 2015-05-06: 30 mg via INTRAVENOUS

## 2015-05-06 MED ORDER — SUCCINYLCHOLINE CHLORIDE 20 MG/ML IJ SOLN
INTRAMUSCULAR | Status: DC | PRN
Start: 2015-05-06 — End: 2015-05-06
  Administered 2015-05-06: 100 mg via INTRAVENOUS

## 2015-05-06 MED ORDER — EPHEDRINE SULFATE 50 MG/ML IJ SOLN
INTRAMUSCULAR | Status: DC | PRN
  Administered 2015-05-06: 10 mg via INTRAVENOUS

## 2015-05-06 MED ORDER — ONDANSETRON HCL 4 MG/2ML IJ SOLN
INTRAMUSCULAR | Status: DC | PRN
  Administered 2015-05-06: 4 mg via INTRAVENOUS

## 2015-05-06 MED ORDER — MIDAZOLAM HCL 2 MG/2ML IJ SOLN
INTRAMUSCULAR | Status: DC | PRN
  Administered 2015-05-06: 1 mg via INTRAVENOUS

## 2015-05-06 MED ORDER — GLYCOPYRROLATE 0.2 MG/ML IJ SOLN
INTRAMUSCULAR | Status: DC | PRN
  Administered 2015-05-06: 0.6 mg via INTRAVENOUS

## 2015-05-06 MED ORDER — FENTANYL CITRATE (PF) 100 MCG/2ML IJ SOLN: 25.0000 ug | INTRAMUSCULAR | Status: DC | PRN

## 2015-05-06 SURGICAL SUPPLY — 25 items
BLADE CLIPPER SURG (BLADE) ×3 IMPLANT
BLADE SURG 15 STRL SS SAFETY (BLADE) ×3 IMPLANT
CANISTER SUCT 1200ML W/VALVE (MISCELLANEOUS) ×3 IMPLANT
CHLORAPREP W/TINT 26ML (MISCELLANEOUS) ×3 IMPLANT
DECANTER SPIKE VIAL GLASS SM (MISCELLANEOUS) ×3 IMPLANT
DRAPE LAPAROTOMY 100X77 ABD (DRAPES) ×3 IMPLANT
GLOVE BIO SURGEON STRL SZ7 (GLOVE) ×15 IMPLANT
GOWN STRL REUS W/ TWL LRG LVL3 (GOWN DISPOSABLE) ×2 IMPLANT
GOWN STRL REUS W/TWL LRG LVL3 (GOWN DISPOSABLE) ×4
KIT RM TURNOVER STRD PROC AR (KITS) ×3 IMPLANT
LABEL OR SOLS (LABEL) IMPLANT
LIQUID BAND (GAUZE/BANDAGES/DRESSINGS) ×3 IMPLANT
MESH VENTRALEX ST 2.5 CRC MED (Mesh General) ×3 IMPLANT
NEEDLE HYPO 25X1 1.5 SAFETY (NEEDLE) ×3 IMPLANT
NS IRRIG 500ML POUR BTL (IV SOLUTION) ×3 IMPLANT
PACK BASIN MINOR ARMC (MISCELLANEOUS) ×3 IMPLANT
PAD GROUND ADULT SPLIT (MISCELLANEOUS) ×3 IMPLANT
SUT PROLENE 0 CT 2 (SUTURE) ×6 IMPLANT
SUT VIC AB 2-0 SH 27 (SUTURE) ×2
SUT VIC AB 2-0 SH 27XBRD (SUTURE) ×1 IMPLANT
SUT VIC AB 3-0 SH 27 (SUTURE) ×2
SUT VIC AB 3-0 SH 27X BRD (SUTURE) ×1 IMPLANT
SUT VIC AB 4-0 FS2 27 (SUTURE) ×3 IMPLANT
SUT VICRYL+ 3-0 144IN (SUTURE) ×3 IMPLANT
SYR CONTROL 10ML (SYRINGE) ×3 IMPLANT

## 2015-05-06 NOTE — Transfer of Care (Signed)
Immediate Anesthesia Transfer of Care Note  Patient: Patrick Burns  Procedure(s) Performed: Procedure(s): HERNIA REPAIR UMBILICAL ADULT (N/A)  Patient Location: PACU  Anesthesia Type:General  Level of Consciousness: awake, alert  and oriented  Airway & Oxygen Therapy: Patient Spontanous Breathing and Patient connected to nasal cannula oxygen  Post-op Assessment: Report given to RN and Post -op Vital signs reviewed and stable  Post vital signs: Reviewed and stable  Last Vitals:  Filed Vitals:   05/06/15 1002  BP: 130/87  Pulse: 86  Temp: 36.4 C  Resp: 15    Complications: No apparent anesthesia complications

## 2015-05-06 NOTE — H&P (View-Only) (Signed)
Patient ID: Pj Brittian, male   DOB: 07/28/1961, 54 y.o.   MRN: [id number]  Chief Complaint  Patient presents with  . Hernia    HPI Patrick Burns is a 54 y.o. male.  Here today for evaluation of possible umbilical hernia. States he has noticed a bulge at his belly button for at least a year. Last week it did have some pain and its worse with sneezing.   HPI  Past Medical History  Diagnosis Date  . Peripheral edema   . Leaky heart valve   . Dyspnea on exertion   . Umbilical hernia   . Hypogonadism male   . Metastatic cancer 2006    Rectum --> prostate --> bladder/chemo and radiation/ [facility]/squamous cell carcinoma anal rectum area    Past Surgical History  Procedure Laterality Date  . Tonsillectomy    . Knee surgery    . Colonoscopy  2011    [facility]    Family History  Problem Relation Age of Onset  . Diabetes Father     Social History Social History  Substance Use Topics  . Smoking status: Current Every Day Smoker -- 35 years  . Smokeless tobacco: Current User    Types: Snuff  . Alcohol Use: No    Allergies  Allergen Reactions  . Biaxin [Clarithromycin]   . Ibuprofen     Current Outpatient Prescriptions  Medication Sig Dispense Refill  . albuterol (VENTOLIN HFA) 108 (90 BASE) MCG/ACT inhaler Inhale 2 puffs into the lungs every 4 (four) hours as needed.     . Aspirin-Acetaminophen-Caffeine (EXCEDRIN MIGRAINE PO) Take by mouth as needed.    . cetirizine (ZYRTEC) 10 MG tablet Take 10 mg by mouth.    . fenofibrate (TRICOR) 145 MG tablet Take 145 mg by mouth.    . fluticasone (FLONASE) 50 MCG/ACT nasal spray 1 SPRAY PER NARES DAILY  5  . glucosamine-chondroitin 500-400 MG tablet Take 1 tablet by mouth 2 (two) times daily.    . Multiple Vitamin (MULTI-VITAMINS) TABS Take by mouth.    . pantoprazole (PROTONIX) 40 MG tablet Take 40 mg by mouth.    . sildenafil (VIAGRA) 100 MG tablet Take 100 mg by mouth.    . vitamin E (VITAMIN E) 400 UNIT capsule Take 400  Units by mouth daily.     No current facility-administered medications for this visit.    Review of Systems Review of Systems  Constitutional: Negative.   Respiratory: Negative.   Cardiovascular: Negative.     Blood pressure 118/84, pulse 70, resp. rate 12, height 6' (1.829 m), weight 294 lb (133.358 kg).  Physical Exam Physical Exam  Constitutional: He is oriented to person, place, and time. He appears well-developed and well-nourished.  HENT:  Mouth/Throat: Oropharynx is clear and moist.  Eyes: Conjunctivae are normal. No scleral icterus.  Neck: Neck supple.  Cardiovascular: Normal rate, regular rhythm and normal heart sounds.   Pulmonary/Chest: Effort normal and breath sounds normal.  Abdominal: Soft. Normal appearance. There is tenderness. A hernia is present. Hernia confirmed negative in the right inguinal area and confirmed negative in the left inguinal area.  Diastasis recti present. Reducible mildly tender 3-4 cm umbilical hernia.  Lymphadenopathy:    He has no cervical adenopathy.  Neurological: He is alert and oriented to person, place, and time.  Skin: Skin is warm and dry.  Psychiatric: His behavior is normal.    Data Reviewed Office notes.  Assessment    Umbilical hernia, symptomatic. Diastasis recti.      Plan    Hernia precautions and incarceration were discussed with the patient. If they develop symptoms of an incarcerated hernia, they were encouraged to seek prompt medical attention.  I have recommended repair of the hernia using mesh on an outpatient basis in the near future. The risk of infection was reviewed. The role of prosthetic mesh to minimize the risk of recurrence was reviewed.     Patient's surgery has been scheduled for 05-06-15 at [facility].   PCP:  Tejan-Sie, S Ahmed  Natelie Ostrosky G 04/22/2015, 1:46 PM    

## 2015-05-06 NOTE — Op Note (Signed)
Preop diagnosis: Umbilical hernia  Post op diagnosis: Same  Operation: Repair umbilical hernia with mesh  Surgeon: S.G.Sankar  Assistant:     Anesthesia: Gen.  Complications: None  EBL: Minimal  Drains: None  Description: Patient was put to sleep with with an endotracheal tube. The umbilical area was prepped and draped as sterile field. Timeout was performed. Patient had a 3 cm hernia protrusion located primarily on the left lip of the umbilicus. A vertical incision encircling the umbilicus on the left side was planned. The area was infiltrated with half percent Marcaine mixed with 1% Xylocaine totaling 20 mL. Skin incision was made. The hernia protrusion was identified and freed from surrounding tissue down to the fascial opening. The hernia contain primarily preperitoneal fat no sac was identified. The fascial opening was 2 cm defect. The fascial edges were lifted up and the hernial protrusion was freed and pushed back into the peritoneal area. The space was then created all around the fascial defect on the posterior aspect up for placement of a circular mesh. A 6.4 cm circular ventral ex mesh was brought up this was then positioned in the preperitoneal space and pulled up with the central straps. The fascial defect was closed in a transverse orientation with the 3 figure-of-eight stitches of 0 proline incorporating the central strap and axis of the strap was excised out. The defect was adequately repaired. Obtained showing hemostasis the deeper tissues were closed with interrupted 3-0 Vicryl. Skin closed with interrupted subcuticular stitches of 4-0 Vicryl and covered with liqui  ban. Patient subsequently was extubated and returned to recovery room in stable condition.

## 2015-05-06 NOTE — Interval H&P Note (Signed)
History and Physical Interval Note:  05/06/2015 8:00 AM  Patrick Burns  has presented today for surgery, with the diagnosis of UMBILICAL HERNIA  The various methods of treatment have been discussed with the patient and family. After consideration of risks, benefits and other options for treatment, the patient has consented to  Procedure(s): HERNIA REPAIR UMBILICAL ADULT (N/A) as a surgical intervention .  The patient's history has been reviewed, patient examined, no change in status, stable for surgery.  I have reviewed the patient's chart and labs.  Questions were answered to the patient's satisfaction.     Lavontay Kirk G

## 2015-05-06 NOTE — Anesthesia Postprocedure Evaluation (Signed)
  Anesthesia Post-op Note  Patient: Patrick Burns  Procedure(s) Performed: Procedure(s): HERNIA REPAIR UMBILICAL ADULT (N/A)  Anesthesia type:General  Patient location: PACU  Post pain: Pain level controlled  Post assessment: Post-op Vital signs reviewed, Patient's Cardiovascular Status Stable, Respiratory Function Stable, Patent Airway and No signs of Nausea or vomiting  Post vital signs: Reviewed and stable  Last Vitals:  Filed Vitals:   05/06/15 1218  BP: 125/82  Pulse:   Temp:   Resp: 16    Level of consciousness: awake, alert  and patient cooperative  Complications: No apparent anesthesia complications

## 2015-05-06 NOTE — Anesthesia Preprocedure Evaluation (Signed)
Anesthesia Evaluation  Patient identified by MRN, date of birth, ID band Patient awake    Reviewed: Allergy & Precautions, NPO status , Patient's Chart, lab work & pertinent test results  Airway Mallampati: III  TM Distance: >3 FB    Comment: Small mouth Dental  (+) Partial Lower, Partial Upper, Missing   Pulmonary shortness of breath and with exertion, asthma , sleep apnea ,    Pulmonary exam normal breath sounds clear to auscultation       Cardiovascular negative cardio ROS Normal cardiovascular exam     Neuro/Psych  Headaches, negative psych ROS   GI/Hepatic Neg liver ROS, GERD  Medicated and Controlled,  Endo/Other  negative endocrine ROS  Renal/GU negative Renal ROS  negative genitourinary   Musculoskeletal negative musculoskeletal ROS (+)   Abdominal Normal abdominal exam  (+)   Peds negative pediatric ROS (+)  Hematology Thick blood?   Anesthesia Other Findings   Reproductive/Obstetrics                             Anesthesia Physical Anesthesia Plan  ASA: III  Anesthesia Plan: General   Post-op Pain Management:    Induction: Intravenous, Rapid sequence and Cricoid pressure planned  Airway Management Planned: Oral ETT  Additional Equipment:   Intra-op Plan:   Post-operative Plan: Extubation in OR  Informed Consent: I have reviewed the patients History and Physical, chart, labs and discussed the procedure including the risks, benefits and alternatives for the proposed anesthesia with the patient or authorized representative who has indicated his/her understanding and acceptance.   Dental advisory given  Plan Discussed with: CRNA and Surgeon  Anesthesia Plan Comments:         Anesthesia Quick Evaluation

## 2015-05-11 ENCOUNTER — Ambulatory Visit (INDEPENDENT_AMBULATORY_CARE_PROVIDER_SITE_OTHER): Payer: BLUE CROSS/BLUE SHIELD | Admitting: General Surgery

## 2015-05-11 ENCOUNTER — Encounter: Payer: Self-pay | Admitting: General Surgery

## 2015-05-11 VITALS — BP 132/72 | HR 74 | Resp 14 | Ht 72.0 in | Wt 294.0 lb

## 2015-05-11 DIAGNOSIS — K429 Umbilical hernia without obstruction or gangrene: Secondary | ICD-10-CM

## 2015-05-11 NOTE — Progress Notes (Signed)
54 year old here for a post op follow up umbilical hernia repair done on 05/06/15. 6.4 cicrcualar ventral mesh was used.  Patient states he is doing well, he reports he did have some drainage a couple of days. No longer draining today. Not taking pain medication.  No signs of infection, incision healing. repair is intact .   Can drive, do no exert activity for another 1-2 weeks  Follow up in one month.  PCP: Elijio Miles

## 2015-05-12 ENCOUNTER — Encounter: Payer: Self-pay | Admitting: General Surgery

## 2015-06-10 ENCOUNTER — Encounter: Payer: Self-pay | Admitting: General Surgery

## 2015-06-10 ENCOUNTER — Ambulatory Visit (INDEPENDENT_AMBULATORY_CARE_PROVIDER_SITE_OTHER): Payer: BLUE CROSS/BLUE SHIELD | Admitting: General Surgery

## 2015-06-10 VITALS — BP 122/72 | HR 73 | Resp 12 | Ht 72.0 in | Wt 290.0 lb

## 2015-06-10 DIAGNOSIS — K429 Umbilical hernia without obstruction or gangrene: Secondary | ICD-10-CM

## 2015-06-10 NOTE — Progress Notes (Signed)
Patient ID: Patrick Burns, male   DOB: [birth year], 54 y.o.   MRN: [id number] Patient here for a post op follow up umbilical hernia repair done on 05/06/15. 6.4 cicrcualar ventral mesh was used. Patient states he is doing well. Incision clean, dry, intact. Repair intact. No evidence of hernia.  Return as needed.  PCP: Elijio Miles

## 2015-06-10 NOTE — Patient Instructions (Signed)
Return as needed

## 2015-10-15 ENCOUNTER — Emergency Department
Admission: EM | Admit: 2015-10-15 | Discharge: 2015-10-15 | Disposition: A | Discharge status: Home / Self Care | Payer: BLUE CROSS/BLUE SHIELD | Attending: Emergency Medicine | Admitting: Emergency Medicine

## 2015-10-15 DIAGNOSIS — Z79899 Other long term (current) drug therapy: Secondary | ICD-10-CM | POA: Insufficient documentation

## 2015-10-15 DIAGNOSIS — A084 Viral intestinal infection, unspecified: Secondary | ICD-10-CM | POA: Diagnosis not present

## 2015-10-15 DIAGNOSIS — R197 Diarrhea, unspecified: Secondary | ICD-10-CM | POA: Diagnosis present

## 2015-10-15 LAB — CBC
HCT: 52.6 % — ABNORMAL HIGH (ref 40.0–52.0)
Hemoglobin: 17.4 g/dL (ref 13.0–18.0)
MCH: 29.4 pg (ref 26.0–34.0)
MCHC: 33.2 g/dL (ref 32.0–36.0)
MCV: 88.5 fL (ref 80.0–100.0)
PLATELETS: 228 10*3/uL (ref 150–440)
RBC: 5.94 MIL/uL — AB (ref 4.40–5.90)
RDW: 13 % (ref 11.5–14.5)
WBC: 9.1 10*3/uL (ref 3.8–10.6)

## 2015-10-15 LAB — LIPASE, BLOOD: LIPASE: 31 U/L (ref 11–51)

## 2015-10-15 LAB — URINALYSIS COMPLETE WITH MICROSCOPIC (ARMC ONLY)
BILIRUBIN URINE: NEGATIVE
Bacteria, UA: NONE SEEN
GLUCOSE, UA: NEGATIVE mg/dL
Hgb urine dipstick: NEGATIVE
Ketones, ur: NEGATIVE mg/dL
Leukocytes, UA: NEGATIVE
Nitrite: NEGATIVE
PH: 6 (ref 5.0–8.0)
Protein, ur: NEGATIVE mg/dL
RBC / HPF: NONE SEEN RBC/hpf (ref 0–5)
SQUAMOUS EPITHELIAL / LPF: NONE SEEN
Specific Gravity, Urine: 1.009 (ref 1.005–1.030)
WBC, UA: NONE SEEN WBC/hpf (ref 0–5)

## 2015-10-15 LAB — COMPREHENSIVE METABOLIC PANEL
ALK PHOS: 49 U/L (ref 38–126)
ALT: 25 U/L (ref 17–63)
AST: 22 U/L (ref 15–41)
Albumin: 4.2 g/dL (ref 3.5–5.0)
Anion gap: 9 (ref 5–15)
BILIRUBIN TOTAL: 0.7 mg/dL (ref 0.3–1.2)
BUN: 16 mg/dL (ref 6–20)
CO2: 25 mmol/L (ref 22–32)
CREATININE: 1.18 mg/dL (ref 0.61–1.24)
Calcium: 8.9 mg/dL (ref 8.9–10.3)
Chloride: 102 mmol/L (ref 101–111)
GFR calc Af Amer: >60 mL/min (ref >60)
Glucose, Bld: 130 mg/dL — ABNORMAL HIGH (ref 65–99)
Potassium: 3.9 mmol/L (ref 3.5–5.1)
SODIUM: 136 mmol/L (ref 135–145)
TOTAL PROTEIN: 7.4 g/dL (ref 6.5–8.1)

## 2015-10-15 MED ORDER — ONDANSETRON 4 MG PO TBDP
4.0000 mg | ORAL_TABLET | Freq: Once | ORAL | Status: AC
  Administered 2015-10-15: 4 mg via ORAL
  Filled 2015-10-15: qty 1

## 2015-10-15 MED ORDER — ONDANSETRON HCL 4 MG PO TABS: 4.0000 mg | ORAL_TABLET | Freq: Three times a day (TID) | ORAL | Status: DC | PRN

## 2015-10-15 MED ORDER — ACETAMINOPHEN 325 MG PO TABS
650.0000 mg | ORAL_TABLET | Freq: Once | ORAL | Status: AC
  Administered 2015-10-15: 650 mg via ORAL
  Filled 2015-10-15: qty 2

## 2015-10-15 NOTE — ED Notes (Signed)
Body aches, diarrhea, and nausea since yesterday.  Co generalized back pain, vomited x 1 yest.

## 2015-10-15 NOTE — ED Provider Notes (Signed)
Time Seen: Approximately 0755 I have reviewed the triage notes  Chief Complaint: Diarrhea   History of Present Illness: Patrick Burns is a 55 y.o. male who presents with a one-day history of nausea, decreased appetite, vomited 1 with 2 episodes of loose watery stool. Patient states he had one large loose bowel movement last evening and then one this morning. He denies any high fevers at home. He denies any focal abdominal pain. He states that he is feeling somewhat improved at this point he just didn't want to "" gets dehydrated "". He denies any feelings of lightheadedness he states he still feels somewhat nauseated. He's had a previous surgical history of umbilical hernia repair. Past Medical History  Diagnosis Date  . Peripheral edema   . Leaky heart valve   . Dyspnea on exertion   . Umbilical hernia   . Hypogonadism male   . 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 Sleep apnea     NOT USING CPAP CURRENTLY  . Asthma   . GERD (gastroesophageal reflux disease)   . Headache     MIGRAINES  . Blood dyscrasia     "BLOOD IS THICK"    Patient Active Problem List   Diagnosis Date Noted  . Umbilical hernia without obstruction and without gangrene 04/22/2015  . Diastasis recti 04/22/2015    Past Surgical History  Procedure Laterality Date  . Tonsillectomy    . Knee surgery    . Colonoscopy  2011    [facility]  . Umbilical hernia repair N/A 05/06/2015    Procedure: HERNIA REPAIR UMBILICAL ADULT;  Surgeon: Christene Lye, MD;  Location: [facility] ORS;  Service: General;  Laterality: N/A;    Past Surgical History  Procedure Laterality Date  . Tonsillectomy    . Knee surgery    . Colonoscopy  2011    [facility]  . Umbilical hernia repair N/A 05/06/2015    Procedure: HERNIA REPAIR UMBILICAL ADULT;  Surgeon: Christene Lye, MD;  Location: [facility] ORS;  Service: General;  Laterality:  N/A;    Current Outpatient Rx  Name  Route  Sig  Dispense  Refill  . albuterol (VENTOLIN HFA) 108 (90 BASE) MCG/ACT inhaler   Inhalation   Inhale 2 puffs into the lungs every 4 (four) hours as needed.          . Aspirin-Acetaminophen-Caffeine (EXCEDRIN MIGRAINE PO)   Oral   Take by mouth as needed.         . Butalbital-APAP-Caffeine (FIORICET PO)   Oral   Take 1 tablet by mouth as needed.         . cetirizine (ZYRTEC) 10 MG tablet   Oral   Take 10 mg by mouth.         . Coenzyme Q10 (CO Q-10) 200 MG CAPS   Oral   Take 1 capsule by mouth daily.         Marland Kitchen EPINEPHrine (EPIPEN IJ)   Injection   Inject 1 Dose as directed as needed.         . fenofibrate (TRICOR) 145 MG tablet   Oral   Take 145 mg by mouth every morning.          . fluticasone (FLONASE) 50 MCG/ACT nasal spray      1 SPRAY PER NARES PRN      5   . glucosamine-chondroitin 500-400 MG  tablet   Oral   Take 1 tablet by mouth 2 (two) times daily.         . Multiple Vitamin (MULTI-VITAMINS) TABS   Oral   Take by mouth.         . ondansetron (ZOFRAN) 4 MG tablet   Oral   Take 1 tablet (4 mg total) by mouth every 8 (eight) hours as needed for nausea or vomiting.   21 tablet   0   . pantoprazole (PROTONIX) 40 MG tablet   Oral   Take 40 mg by mouth every morning.          . sildenafil (REVATIO) 20 MG tablet      Take 1 tablet (20 mg total) by mouth daily as needed.      11   . sildenafil (VIAGRA) 100 MG tablet   Oral   Take 100 mg by mouth as needed.          . vitamin E (VITAMIN E) 400 UNIT capsule   Oral   Take 400 Units by mouth daily.           Allergies:  Bee venom; Ibuprofen; Biaxin; and Mango flavor  Family History: Family History  Problem Relation Age of Onset  . Diabetes Father     Social History: Social History  Substance Use Topics  . Smoking status: Never Smoker   . Smokeless tobacco: Current User    Types: Snuff  . Alcohol Use: 0.0 oz/week     0 Standard drinks or equivalent per week     Comment: OCC SIP MOONSHINE     Review of Systems:   10 point review of systems was performed and was otherwise negative:  Constitutional: No fever Eyes: No visual disturbances ENT: No sore throat, ear pain Cardiac: No chest pain Respiratory: No shortness of breath, wheezing, or stridor Abdomen: No abdominal pain, no vomiting, No diarrhea Endocrine: No weight loss, No night sweats Extremities: No peripheral edema, cyanosis Skin: No rashes, easy bruising Neurologic: No focal weakness, trouble with speech or swollowing Urologic: No dysuria, Hematuria, or urinary frequency   Physical Exam:  ED Triage Vitals  Enc Vitals Group     BP 10/15/15 0600 135/54 mmHg     Pulse Rate 10/15/15 0600 118     Resp 10/15/15 0600 18     Temp 10/15/15 0600 99.2 F (37.3 C)     Temp src --      SpO2 10/15/15 0600 93 %     Weight 10/15/15 0600 300 lb (136.079 kg)     Height 10/15/15 0600 6' (1.829 m)     Head Cir --      Peak Flow --      Pain Score 10/15/15 0600 5     Pain Loc --      Pain Edu? --      Excl. in [place]? --     General: Awake , Alert , and Oriented times 3; GCS 15 Head: Normal cephalic , atraumatic Eyes: Pupils equal , round, reactive to light Nose/Throat: No nasal drainage, patent upper airway without erythema or exudate.  Neck: Supple, Full range of motion, No anterior adenopathy or palpable thyroid masses Lungs: Clear to ascultation without wheezes , rhonchi, or rales Heart: Regular rate, regular rhythm without murmurs , gallops , or rubs Abdomen: Soft, non tender without rebound, guarding , or rigidity; bowel sounds positive and symmetric in all 4 quadrants. No organomegaly .  Extremities: 2 plus symmetric pulses. No edema, clubbing or cyanosis Neurologic: normal ambulation, Motor symmetric without deficits, sensory intact Skin: warm, dry, no rashes   Labs:   All laboratory work was reviewed including any pertinent  negatives or positives listed below:  Labs Reviewed  CBC - Abnormal; Notable for the following:    RBC 5.94 (*)    HCT 52.6 (*)    All other components within normal limits  COMPREHENSIVE METABOLIC PANEL - Abnormal; Notable for the following:    Glucose, Bld 130 (*)    All other components within normal limits  LIPASE, BLOOD  URINALYSIS COMPLETEWITH MICROSCOPIC ([place])   reviewed the patient's laboratory work shows no significant abnormalities    ED Course:  The patient's stay here was uneventful since he had no persistent vomiting and his illness was less than 24 hours if he just had some mild dehydration. He was given Zofran and oral fluids which she was able tolerate well and I felt he could be treated on an outpatient basis. He has no history of travel or exposure to oral antibiotics and I felt this was unlikely to be invasive diarrhea or C. difficile. Charged on Zofran and encouraged to drink plenty of fluids and control his temperature home with Tylenol. Advance his diet as tolerated and return here if he has any other new concerns.  Assessment:  Viral gastroenteritis  Final Clinical Impression:  Final diagnoses:  Viral gastroenteritis     Plan: * Outpatient management Patient was advised to return immediately if condition worsens. Patient was advised to follow up with their primary care physician or other specialized physicians involved in their outpatient care            Daymon Larsen, MD 10/15/15 1019

## 2015-10-15 NOTE — ED Notes (Signed)
Pt verbalized understanding of discharge instructions. NAD at this time. 

## 2015-10-15 NOTE — Discharge Instructions (Signed)
Viral Gastroenteritis °Viral gastroenteritis is also known as stomach flu. This condition affects the stomach and intestinal tract. It can cause sudden diarrhea and vomiting. The illness typically lasts 3 to 8 days. Most people develop an immune response that eventually gets rid of the virus. While this natural response develops, the virus can make you quite ill. °CAUSES  °Many different viruses can cause gastroenteritis, such as rotavirus or noroviruses. You can catch one of these viruses by consuming contaminated food or water. You may also catch a virus by sharing utensils or other personal items with an infected person or by touching a contaminated surface. °SYMPTOMS  °The most common symptoms are diarrhea and vomiting. These problems can cause a severe loss of body fluids (dehydration) and a body salt (electrolyte) imbalance. Other symptoms may include: °· Fever. °· Headache. °· Fatigue. °· Abdominal pain. °DIAGNOSIS  °Your caregiver can usually diagnose viral gastroenteritis based on your symptoms and a physical exam. A stool sample may also be taken to test for the presence of viruses or other infections. °TREATMENT  °This illness typically goes away on its own. Treatments are aimed at rehydration. The most serious cases of viral gastroenteritis involve vomiting so severely that you are not able to keep fluids down. In these cases, fluids must be given through an intravenous line (IV). °HOME CARE INSTRUCTIONS  °· Drink enough fluids to keep your urine clear or pale yellow. Drink small amounts of fluids frequently and increase the amounts as tolerated. °· Ask your caregiver for specific rehydration instructions. °· Avoid: °¨ Foods high in sugar. °¨ Alcohol. °¨ Carbonated drinks. °¨ Tobacco. °¨ Juice. °¨ Caffeine drinks. °¨ Extremely hot or cold fluids. °¨ Fatty, greasy foods. °¨ Too much intake of anything at one time. °¨ Dairy products until 24 to 48 hours after diarrhea stops. °· You may consume probiotics.  Probiotics are active cultures of beneficial bacteria. They may lessen the amount and number of diarrheal stools in adults. Probiotics can be found in yogurt with active cultures and in supplements. °· Wash your hands well to avoid spreading the virus. °· Only take over-the-counter or prescription medicines for pain, discomfort, or fever as directed by your caregiver. Do not give aspirin to children. Antidiarrheal medicines are not recommended. °· Ask your caregiver if you should continue to take your regular prescribed and over-the-counter medicines. °· Keep all follow-up appointments as directed by your caregiver. °SEEK IMMEDIATE MEDICAL CARE IF:  °· You are unable to keep fluids down. °· You do not urinate at least once every 6 to 8 hours. °· You develop shortness of breath. °· You notice blood in your stool or vomit. This may look like coffee grounds. °· You have abdominal pain that increases or is concentrated in one small area (localized). °· You have persistent vomiting or diarrhea. °· You have a fever. °· The patient is a child younger than 3 months, and he or she has a fever. °· The patient is a child older than 3 months, and he or she has a fever and persistent symptoms. °· The patient is a child older than 3 months, and he or she has a fever and symptoms suddenly get worse. °· The patient is a baby, and he or she has no tears when crying. °MAKE SURE YOU:  °· Understand these instructions. °· Will watch your condition. °· Will get help right away if you are not doing well or get worse. °  °This information is not intended to replace   advice given to you by your health care provider. Make sure you discuss any questions you have with your health care provider.   Document Released: 08/14/2005 Document Revised: 11/06/2011 Document Reviewed: 05/31/2011 Elsevier Interactive Patient Education [employer].  Please return immediately if condition worsens. Please contact her primary physician or the  physician you were given for referral. If you have any specialist physicians involved in her treatment and plan please also contact them. Thank you for using [place] [facility] emergency [facility].

## 2016-03-22 ENCOUNTER — Emergency Department: Payer: BLUE CROSS/BLUE SHIELD

## 2016-03-22 ENCOUNTER — Encounter: Payer: Self-pay | Admitting: Emergency Medicine

## 2016-03-22 ENCOUNTER — Emergency Department
Admission: EM | Admit: 2016-03-22 | Discharge: 2016-03-22 | Disposition: A | Discharge status: Home / Self Care | Payer: BLUE CROSS/BLUE SHIELD | Attending: Emergency Medicine | Admitting: Emergency Medicine

## 2016-03-22 DIAGNOSIS — F17290 Nicotine dependence, other tobacco product, uncomplicated: Secondary | ICD-10-CM | POA: Diagnosis not present

## 2016-03-22 DIAGNOSIS — Y999 Unspecified external cause status: Secondary | ICD-10-CM | POA: Insufficient documentation

## 2016-03-22 DIAGNOSIS — R52 Pain, unspecified: Secondary | ICD-10-CM

## 2016-03-22 DIAGNOSIS — J45909 Unspecified asthma, uncomplicated: Secondary | ICD-10-CM | POA: Diagnosis not present

## 2016-03-22 DIAGNOSIS — Z85828 Personal history of other malignant neoplasm of skin: Secondary | ICD-10-CM | POA: Diagnosis not present

## 2016-03-22 DIAGNOSIS — Y9389 Activity, other specified: Secondary | ICD-10-CM | POA: Insufficient documentation

## 2016-03-22 DIAGNOSIS — M79671 Pain in right foot: Secondary | ICD-10-CM | POA: Insufficient documentation

## 2016-03-22 DIAGNOSIS — Y92828 Other wilderness area as the place of occurrence of the external cause: Secondary | ICD-10-CM | POA: Diagnosis not present

## 2016-03-22 DIAGNOSIS — Z7982 Long term (current) use of aspirin: Secondary | ICD-10-CM | POA: Diagnosis not present

## 2016-03-22 DIAGNOSIS — X501XXA Overexertion from prolonged static or awkward postures, initial encounter: Secondary | ICD-10-CM | POA: Diagnosis not present

## 2016-03-22 DIAGNOSIS — Z79899 Other long term (current) drug therapy: Secondary | ICD-10-CM | POA: Diagnosis not present

## 2016-03-22 MED ORDER — HYDROCODONE-ACETAMINOPHEN 5-325 MG PO TABS: 1.0000 | ORAL_TABLET | ORAL | 0 refills | Status: AC | PRN

## 2016-03-22 NOTE — ED Provider Notes (Signed)
[facility] Emergency Department Provider Note ____________________________________________  Time seen: Approximately 7:01 PM  I have reviewed the triage vital signs and the nursing notes.   HISTORY  Chief Complaint Foot Pain    HPI Patrick Burns is a 55 y.o. male who presents to the emergency department for evaluation of right foot pain. He reports having history of plantar fasciitis. He was pushing a lawnmower up a hill and felt something in the right foot pop.He states the pain was very sharp, like something was tearing at the onset and then burned really bad.  Past Medical History:  Diagnosis Date  . Asthma   . Blood dyscrasia    "BLOOD IS THICK"  . Dyspnea on exertion   . GERD (gastroesophageal reflux disease)   . Headache    MIGRAINES  . Hypogonadism male   . Leaky heart valve   . Metastatic cancer ([place]) 2006   Rectum --> prostate --> bladder/chemo and radiation/ [facility]/squamous cell carcinoma anal rectum area  . Peripheral edema   . Sleep apnea    NOT USING CPAP CURRENTLY  . Umbilical hernia     Patient Active Problem List   Diagnosis Date Noted  . Umbilical hernia without obstruction and without gangrene 04/22/2015  . Diastasis recti 04/22/2015    Past Surgical History:  Procedure Laterality Date  . COLONOSCOPY  2011   [facility]  . KNEE SURGERY    . TONSILLECTOMY    . UMBILICAL HERNIA REPAIR N/A 05/06/2015   Procedure: HERNIA REPAIR UMBILICAL ADULT;  Surgeon: Christene Lye, MD;  Location: [facility] ORS;  Service: General;  Laterality: N/A;    Current Outpatient Rx  . Order #: [id number] Class: Historical Med  . Order #: [id number] Class: Historical Med  . Order #: [id number] Class: Historical Med  . Order #: [id number] Class: Historical Med  . Order #: [id number] Class: Historical Med  . Order #: [id number] Class: Historical Med  . Order #: [id number] Class: Historical Med  . Order #: [id number] Class: Historical Med  . Order #:  [id number] Class: Historical Med  . Order #: [id number] Class: Print  . Order #: [id number] Class: Historical Med  . Order #: [id number] Class: Print  . Order #: [id number] Class: Historical Med  . Order #: [id number] Class: Historical Med  . Order #: [id number] Class: Historical Med  . Order #: [id number] Class: Historical Med    Allergies Bee venom; Ibuprofen; Biaxin [clarithromycin]; and Mango flavor  Family History  Problem Relation Age of Onset  . Diabetes Father     Social History Social History  Substance Use Topics  . Smoking status: Never Smoker  . Smokeless tobacco: Current User    Types: Snuff  . Alcohol use 0.0 oz/week     Comment: OCC SIP MOONSHINE    Review of Systems Constitutional: No recent illness. Cardiovascular: Denies chest pain or palpitations. Respiratory: Denies shortness of breath. Musculoskeletal: Pain in right foot Skin: Negative for rash, wound, lesion. Neurological: Negative for focal weakness or numbness.  ____________________________________________   PHYSICAL EXAM:  VITAL SIGNS: ED Triage Vitals [03/22/16 1849]  Enc Vitals Group     BP 131/82     Pulse Rate 85     Resp 20     Temp 97.9 F (36.6 C)     Temp Source Oral     SpO2 95 %     Weight (!) 315 lb (142.9 kg)     Height 6' (1.829 m)     Head Circumference      Peak Flow  Pain Score      Pain Loc      Pain Edu?      Excl. in [place]?     Constitutional: Alert and oriented. Well appearing and in no acute distress. Eyes: Conjunctivae are normal. EOMI. Head: Atraumatic. Neck: No stridor.  Respiratory: Normal respiratory effort.   Musculoskeletal: Tenderness noted on palpation just below the arch toward the heel with radiation to the medial aspect of the foot.  Neurologic:  Normal speech and language. No gross focal neurologic deficits are appreciated. Speech is normal. No gait instability. Skin:  Skin is warm, dry and intact. Atraumatic. Psychiatric: Mood and affect are normal.  Speech and behavior are normal.  ____________________________________________   LABS (all labs ordered are listed, but only abnormal results are displayed)  Labs Reviewed - No data to display ____________________________________________  RADIOLOGY  Right foot negative for acute bony abnormality per radiology. ____________________________________________   PROCEDURES  Procedure(s) performed:   ACE bandage applied to right foot as well as postop shoe. Neurovascularly intact post application.   ____________________________________________   INITIAL IMPRESSION / ASSESSMENT AND PLAN / ED COURSE  Pertinent labs & imaging results that were available during my care of the patient were reviewed by me and considered in my medical decision making (see chart for details).  Patient was instructed to follow up with orthopedics. He is to call tomorrow to schedule an appointment. He was advised to take the pain medication as prescribed. He was advised to return to the emergency department for symptoms that change or worsen if he is unable to see Dr. Cleda Mccreedy or his PCP right away. ____________________________________________   FINAL CLINICAL IMPRESSION(S) / ED DIAGNOSES  Final diagnoses:  Foot pain, right       Victorino Dike, FNP 03/22/16 2139    Nance Pear, MD 03/22/16 [phone]

## 2016-03-22 NOTE — ED Notes (Signed)
Discharge instructions reviewed with patient. Questions fielded by this RN. Patient verbalizes understanding of instructions. Patient discharged home in stable condition per [place] NP . No acute distress noted at time of discharge.

## 2016-03-22 NOTE — ED Triage Notes (Signed)
Pt reports hx plantar fascitis.  Was pushing lawn mower up hill and something in right foot popped. Pt has swelling to both feet at baseline; this has nto changed.

## 2016-12-10 ENCOUNTER — Emergency Department
Admission: EM | Admit: 2016-12-10 | Discharge: 2016-12-10 | Disposition: A | Discharge status: Home / Self Care | Payer: BLUE CROSS/BLUE SHIELD | Attending: Emergency Medicine | Admitting: Emergency Medicine

## 2016-12-10 ENCOUNTER — Encounter: Payer: Self-pay | Admitting: Emergency Medicine

## 2016-12-10 DIAGNOSIS — Z7982 Long term (current) use of aspirin: Secondary | ICD-10-CM | POA: Diagnosis not present

## 2016-12-10 DIAGNOSIS — S40261A Insect bite (nonvenomous) of right shoulder, initial encounter: Secondary | ICD-10-CM | POA: Diagnosis present

## 2016-12-10 DIAGNOSIS — F17290 Nicotine dependence, other tobacco product, uncomplicated: Secondary | ICD-10-CM | POA: Insufficient documentation

## 2016-12-10 DIAGNOSIS — Y939 Activity, unspecified: Secondary | ICD-10-CM | POA: Diagnosis not present

## 2016-12-10 DIAGNOSIS — J45909 Unspecified asthma, uncomplicated: Secondary | ICD-10-CM | POA: Insufficient documentation

## 2016-12-10 DIAGNOSIS — Y999 Unspecified external cause status: Secondary | ICD-10-CM | POA: Insufficient documentation

## 2016-12-10 DIAGNOSIS — W57XXXA Bitten or stung by nonvenomous insect and other nonvenomous arthropods, initial encounter: Secondary | ICD-10-CM | POA: Insufficient documentation

## 2016-12-10 DIAGNOSIS — L03113 Cellulitis of right upper limb: Secondary | ICD-10-CM | POA: Diagnosis not present

## 2016-12-10 DIAGNOSIS — Y929 Unspecified place or not applicable: Secondary | ICD-10-CM | POA: Diagnosis not present

## 2016-12-10 DIAGNOSIS — Z853 Personal history of malignant neoplasm of breast: Secondary | ICD-10-CM | POA: Insufficient documentation

## 2016-12-10 DIAGNOSIS — Z79899 Other long term (current) drug therapy: Secondary | ICD-10-CM | POA: Insufficient documentation

## 2016-12-10 MED ORDER — MUPIROCIN CALCIUM 2 % EX CREA: TOPICAL_CREAM | CUTANEOUS | 0 refills | Status: DC

## 2016-12-10 MED ORDER — SULFAMETHOXAZOLE-TRIMETHOPRIM 800-160 MG PO TABS: 1.0000 | ORAL_TABLET | Freq: Two times a day (BID) | ORAL | 0 refills | Status: DC

## 2016-12-10 NOTE — ED Notes (Signed)
FIRST NURSE NOTE: Tick bite to right upper shoulder, pulled out head but area is red and warm

## 2016-12-10 NOTE — ED Provider Notes (Signed)
[facility] Emergency Department Provider Note  ____________________________________________  Time seen: Approximately 3:16 PM  I have reviewed the triage vital signs and the nursing notes.   HISTORY  Chief Complaint Tick Removal    HPI Patrick Burns is a 56 y.o. male who presents emergency department complaining of wound to the right shoulder. Patient reports that he has a due to tick out of his shoulder 2 days prior. He reports that there is redness, mild swelling around the area. He denies any fevers or chills, bodyaches, chest pain, shortness of breath, nausea or vomiting. Patient denies any other rashes. No complaints. No history of recurrent skin infections.   Past Medical History:  Diagnosis Date  . Asthma   . Blood dyscrasia    "BLOOD IS THICK"  . Dyspnea on exertion   . GERD (gastroesophageal reflux disease)   . Headache    MIGRAINES  . Hypogonadism male   . Leaky heart valve   . 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 Peripheral edema   . Sleep apnea    NOT USING CPAP CURRENTLY  . Umbilical hernia     Patient Active Problem List   Diagnosis Date Noted  . Umbilical hernia without obstruction and without gangrene 04/22/2015  . Diastasis recti 04/22/2015    Past Surgical History:  Procedure Laterality Date  . COLONOSCOPY  2011   [facility]  . KNEE SURGERY    . TONSILLECTOMY    . UMBILICAL HERNIA REPAIR N/A 05/06/2015   Procedure: HERNIA REPAIR UMBILICAL ADULT;  Surgeon: Christene Lye, MD;  Location: [facility] ORS;  Service: General;  Laterality: N/A;    Prior to Admission medications   Medication Sig Start Date End Date Taking? Authorizing Provider  albuterol (VENTOLIN HFA) 108 (90 BASE) MCG/ACT inhaler Inhale 2 puffs into the lungs every 4 (four) hours as needed.  12/21/11   Historical Provider, MD  Aspirin-Acetaminophen-Caffeine  (EXCEDRIN MIGRAINE PO) Take by mouth as needed.    Historical Provider, MD  Butalbital-APAP-Caffeine (FIORICET PO) Take 1 tablet by mouth as needed.    Historical Provider, MD  cetirizine (ZYRTEC) 10 MG tablet Take 10 mg by mouth.    Historical Provider, MD  Coenzyme Q10 (CO Q-10) 200 MG CAPS Take 1 capsule by mouth daily.    Historical Provider, MD  EPINEPHrine (EPIPEN IJ) Inject 1 Dose as directed as needed.    Historical Provider, MD  fenofibrate (TRICOR) 145 MG tablet Take 145 mg by mouth every morning.  12/21/11   Historical Provider, MD  fluticasone (FLONASE) 50 MCG/ACT nasal spray 1 SPRAY PER NARES PRN 03/04/15   Historical Provider, MD  glucosamine-chondroitin 500-400 MG tablet Take 1 tablet by mouth 2 (two) times daily.    Historical Provider, MD  HYDROcodone-acetaminophen (NORCO/VICODIN) 5-325 MG tablet Take 1 tablet by mouth every 4 (four) hours as needed for moderate pain. 03/22/16 03/22/17  Victorino Dike, FNP  Multiple Vitamin (MULTI-VITAMINS) TABS Take by mouth.    Historical Provider, MD  mupirocin cream (BACTROBAN) 2 % Apply to affected area 3 times daily 12/10/16   Charline Bills Jackilyn Umphlett, PA-C  ondansetron (ZOFRAN) 4 MG tablet Take 1 tablet (4 mg total) by mouth every 8 (eight) hours as needed for nausea or vomiting. 10/15/15   Daymon Larsen, MD  pantoprazole (PROTONIX) 40 MG tablet Take 40 mg by mouth every morning.  12/21/11   Historical Provider,  MD  sildenafil (REVATIO) 20 MG tablet Take 1 tablet (20 mg total) by mouth daily as needed. 06/01/15   Historical Provider, MD  sildenafil (VIAGRA) 100 MG tablet Take 100 mg by mouth as needed.  08/07/14   Historical Provider, MD  sulfamethoxazole-trimethoprim (BACTRIM DS,SEPTRA DS) 800-160 MG tablet Take 1 tablet by mouth 2 (two) times daily. 12/10/16   Charline Bills Remas Sobel, PA-C  vitamin E (VITAMIN E) 400 UNIT capsule Take 400 Units by mouth daily.    Historical Provider, MD    Allergies Bee venom; Ibuprofen; Biaxin [clarithromycin]; and  Mango flavor  Family History  Problem Relation Age of Onset  . Diabetes Father     Social History Social History  Substance Use Topics  . Smoking status: Never Smoker  . Smokeless tobacco: Current User    Types: Snuff  . Alcohol use 0.0 oz/week     Comment: OCC SIP MOONSHINE     Review of Systems  Constitutional: No fever/chills Eyes: No visual changes. No discharge ENT: No upper respiratory complaints. Cardiovascular: no chest pain. Respiratory: no cough. No SOB. Gastrointestinal: No abdominal pain.  No nausea, no vomiting.   Musculoskeletal: Negative for musculoskeletal pain. Skin: Positive for infected tick bite. Neurological: Negative for headaches, focal weakness or numbness. 10-point ROS otherwise negative.  ____________________________________________   PHYSICAL EXAM:  VITAL SIGNS: ED Triage Vitals  Enc Vitals Group     BP 12/10/16 1441 118/83     Pulse Rate 12/10/16 1441 96     Resp 12/10/16 1441 18     Temp 12/10/16 1441 97.9 F (36.6 C)     Temp Source 12/10/16 1441 Oral     SpO2 12/10/16 1441 91 %     Weight 12/10/16 1441 (!) 315 lb (142.9 kg)     Height 12/10/16 1441 6' (1.829 m)     Head Circumference --      Peak Flow --      Pain Score 12/10/16 1438 0     Pain Loc --      Pain Edu? --      Excl. in [place]? --      Constitutional: Alert and oriented. Well appearing and in no acute distress. Eyes: Conjunctivae are normal. PERRL. EOMI. Head: Atraumatic. Neck: No stridor.    Cardiovascular: Normal rate, regular rhythm. Normal S1 and S2.  Good peripheral circulation. Respiratory: Normal respiratory effort without tachypnea or retractions. Lungs CTAB. Good air entry to the bases with no decreased or absent breath sounds. Musculoskeletal: Full range of motion to all extremities. No gross deformities appreciated. Neurologic:  Normal speech and language. No gross focal neurologic deficits are appreciated.  Skin:  Skin is warm, dry and intact. No rash  noted.Skin findings right shoulder: Central excoriation for removal of tick. Surrounding area is mildly erythematous and edematous. There is tender to palpation. No fluctuance or induration. No drainage. Psychiatric: Mood and affect are normal. Speech and behavior are normal. Patient exhibits appropriate insight and judgement.   ____________________________________________   LABS (all labs ordered are listed, but only abnormal results are displayed)  Labs Reviewed - No data to display ____________________________________________  EKG   ____________________________________________  RADIOLOGY   No results found.  ____________________________________________    PROCEDURES  Procedure(s) performed:    Procedures    Medications - No data to display   ____________________________________________   INITIAL IMPRESSION / ASSESSMENT AND PLAN / ED COURSE  Pertinent labs & imaging results that were available during my care of the patient  were reviewed by me and considered in my medical decision making (see chart for details).  Review of the [place] CSRS was performed in accordance of the [facility] prior to dispensing any controlled drugs.     Patient's diagnosis is consistent with infected tick bite. No indication recommended spotted fever or Lyme's disease. No titers were drawn. Patient has mild cellulitis from removal of a tech. Patient did excoriate into the tissue removal and likely caused superficial infection. No indication of abscess requiring incision and drainage.. Patient will be discharged home with prescriptions for antibiotic and antibiotic topical. Patient is to follow up with primary care as needed or otherwise directed. Patient is given ED precautions to return to the ED for any worsening or new symptoms.     ____________________________________________  FINAL CLINICAL IMPRESSION(S) / ED DIAGNOSES  Final diagnoses:  Infected tick bite, initial encounter       NEW MEDICATIONS STARTED DURING THIS VISIT:  New Prescriptions   MUPIROCIN CREAM (BACTROBAN) 2 %    Apply to affected area 3 times daily   SULFAMETHOXAZOLE-TRIMETHOPRIM (BACTRIM DS,SEPTRA DS) 800-160 MG TABLET    Take 1 tablet by mouth 2 (two) times daily.        This chart was dictated using voice recognition software/Dragon. Despite best efforts to proofread, errors can occur which can change the meaning. Any change was purely unintentional.    Darletta Moll, PA-C 12/10/16 [place], MD 12/10/16 2241

## 2016-12-10 NOTE — ED Notes (Signed)
Wound noted to right anterior shoulder. Approx: 0.25" in diameter with 0.50" area of redness surrounding. Patient states he pulled a tick from his shoulder. Patient unaware of any discharge.

## 2016-12-10 NOTE — ED Triage Notes (Signed)
Reports removing head of a tick 2 days ago c/o right arm warmth and redness around site.  Bilateral arms equally warm.

## 2017-09-17 ENCOUNTER — Encounter (INDEPENDENT_AMBULATORY_CARE_PROVIDER_SITE_OTHER): Payer: BLUE CROSS/BLUE SHIELD | Admitting: Vascular Surgery

## 2017-10-01 ENCOUNTER — Encounter (INDEPENDENT_AMBULATORY_CARE_PROVIDER_SITE_OTHER): Payer: BLUE CROSS/BLUE SHIELD | Admitting: Vascular Surgery

## 2017-10-02 ENCOUNTER — Encounter (INDEPENDENT_AMBULATORY_CARE_PROVIDER_SITE_OTHER): Payer: BLUE CROSS/BLUE SHIELD | Admitting: Vascular Surgery

## 2017-10-02 ENCOUNTER — Encounter (INDEPENDENT_AMBULATORY_CARE_PROVIDER_SITE_OTHER): Payer: Self-pay

## 2017-12-03 ENCOUNTER — Encounter (INDEPENDENT_AMBULATORY_CARE_PROVIDER_SITE_OTHER): Payer: Self-pay | Admitting: Vascular Surgery

## 2017-12-03 ENCOUNTER — Ambulatory Visit (INDEPENDENT_AMBULATORY_CARE_PROVIDER_SITE_OTHER): Payer: BLUE CROSS/BLUE SHIELD | Admitting: Vascular Surgery

## 2017-12-03 VITALS — BP 107/74 | HR 73 | Resp 16 | Ht 72.0 in | Wt 316.8 lb

## 2017-12-03 DIAGNOSIS — I10 Essential (primary) hypertension: Secondary | ICD-10-CM | POA: Diagnosis not present

## 2017-12-03 DIAGNOSIS — E785 Hyperlipidemia, unspecified: Secondary | ICD-10-CM

## 2017-12-03 DIAGNOSIS — M79605 Pain in left leg: Secondary | ICD-10-CM | POA: Diagnosis not present

## 2017-12-03 DIAGNOSIS — M79604 Pain in right leg: Secondary | ICD-10-CM | POA: Diagnosis not present

## 2017-12-03 DIAGNOSIS — R600 Localized edema: Secondary | ICD-10-CM

## 2017-12-03 NOTE — Progress Notes (Signed)
Subjective:    Patient ID: Patrick Burns, male    DOB: 25-Jun-1961, 57 y.o.   MRN: [id number] Chief Complaint  Patient presents with  . New Patient (Initial Visit)    ref Humphrey Rolls for Varicose vein   Presents as a new patient referred by Dr. Humphrey Rolls for evaluation of bilateral lower extremity pain and edema.  The patient endorses a history of swelling to his legs since 2004.  The patient denies any trauma or surgery to the bilateral lower extremity.  The patient notes that his "swelling" is "constant".  He does not note that it improves or worsens throughout the day.  The patient has been diagnosed with cellulitis in the past.  The patient denies any rest pain or ulceration to the bilateral lower extremity however does experience what sounds like claudication-like symptoms.  At this time, the patient does not engage in conservative therapy the including wearing medical grade 1 compression socks and elevating his legs all remaining active.  The patient denies a past medical history of DVT or SVT.  The patient denies any fever, nausea or vomiting.  The patient does note that his symptoms have progressed to the point that he is unable to function on a daily basis and feel that they have become lifestyle limiting.  Review of Systems  Constitutional: Negative.   HENT: Negative.   Eyes: Negative.   Respiratory: Negative.   Cardiovascular: Positive for leg swelling.  Gastrointestinal: Negative.        Lower extremity claudication  Endocrine: Negative.   Genitourinary: Negative.   Musculoskeletal: Negative.   Skin: Negative.   Allergic/Immunologic: Negative.   Neurological: Negative.   Hematological: Negative.   Psychiatric/Behavioral: Negative.       Objective:   Physical Exam  Constitutional: He is oriented to person, place, and time. He appears well-developed and well-nourished. No distress.  HENT:  Head: Normocephalic and atraumatic.  Eyes: Pupils are equal, round, and reactive to light.  Conjunctivae are normal.  Neck: Normal range of motion.  Cardiovascular: Normal rate, regular rhythm, normal heart sounds and intact distal pulses.  Pulses:      Radial pulses are 2+ on the right side, and 2+ on the left side.  Hard to palpate pedal pulses due to edema and body habitus however the bilateral feet are warm.  Pulmonary/Chest: Effort normal and breath sounds normal.  Musculoskeletal: Normal range of motion. He exhibits edema (Moderate to severe 2+ pitting edema noted bilaterally.  Right lower extremity is slightly more edematous going compared to the left.).  Neurological: He is alert and oriented to person, place, and time.  Skin: Skin is warm and dry. He is not diaphoretic.  Moderate stasis dermatitis noted to the bilateral calves.  There is the beginning of skin thickening.  There is no cellulitis.  Skin is intact.  Psychiatric: He has a normal mood and affect. His behavior is normal. Judgment and thought content normal.  Vitals reviewed.  BP 107/74 (BP Location: Right Arm)   Pulse 73   Resp 16   Ht 6' (1.829 m)   Wt (!) 316 lb 12.8 oz (143.7 kg)   BMI 42.97 kg/m   Past Medical History:  Diagnosis Date  . Asthma   . Blood dyscrasia    "BLOOD IS THICK"  . Dyspnea on exertion   . GERD (gastroesophageal reflux disease)   . Headache    MIGRAINES  . Hypogonadism male   . Leaky heart valve   . Metastatic cancer (  [place]) 2006   Rectum --> prostate --> bladder/chemo and radiation/ [facility]/squamous cell carcinoma anal rectum area  . Peripheral edema   . Sleep apnea    NOT USING CPAP CURRENTLY  . Umbilical hernia    Social History   Socioeconomic History  . Marital status: Married    Spouse name: Not on file  . Number of children: Not on file  . Years of education: Not on file  . Highest education level: Not on file  Occupational History  . Not on file  Social Needs  . Financial resource strain: Not on file  . Food insecurity:    Worry: Not on file     Inability: Not on file  . Transportation needs:    Medical: Not on file    Non-medical: Not on file  Tobacco Use  . Smoking status: Never Smoker  . Smokeless tobacco: Current User    Types: Snuff  Substance and Sexual Activity  . Alcohol use: Yes    Alcohol/week: 0.0 oz    Comment: OCC SIP MOONSHINE  . Drug use: No  . Sexual activity: Not on file  Lifestyle  . Physical activity:    Days per week: Not on file    Minutes per session: Not on file  . Stress: Not on file  Relationships  . Social connections:    Talks on phone: Not on file    Gets together: Not on file    Attends religious service: Not on file    Active member of club or organization: Not on file    Attends meetings of clubs or organizations: Not on file    Relationship status: Not on file  . Intimate partner violence:    Fear of current or ex partner: Not on file    Emotionally abused: Not on file    Physically abused: Not on file    Forced sexual activity: Not on file  Other Topics Concern  . Not on file  Social History Narrative  . Not on file   Past Surgical History:  Procedure Laterality Date  . COLONOSCOPY  2011   [facility]  . KNEE SURGERY    . TONSILLECTOMY    . UMBILICAL HERNIA REPAIR N/A 05/06/2015   Procedure: HERNIA REPAIR UMBILICAL ADULT;  Surgeon: Christene Lye, MD;  Location: [facility] ORS;  Service: General;  Laterality: N/A;   Family History  Problem Relation Age of Onset  . Diabetes Father    Allergies  Allergen Reactions  . Bee Venom Anaphylaxis  . Ibuprofen Anaphylaxis  . Biaxin [Clarithromycin] Nausea And Vomiting  . Mango Flavor Swelling    LIPS      Assessment & Plan:  Presents as a new patient referred by Dr. Humphrey Rolls for evaluation of bilateral lower extremity pain and edema.  The patient endorses a history of swelling to his legs since 2004.  The patient denies any trauma or surgery to the bilateral lower extremity.  The patient notes that his "swelling" is "constant".  He does  not note that it improves or worsens throughout the day.  The patient has been diagnosed with cellulitis in the past.  The patient denies any rest pain or ulceration to the bilateral lower extremity however does experience what sounds like claudication-like symptoms.  At this time, the patient does not engage in conservative therapy the including wearing medical grade 1 compression socks and elevating his legs all remaining active.  The patient denies a past medical history of DVT  or SVT.  The patient denies any fever, nausea or vomiting.  The patient does note that his symptoms have progressed to the point that he is unable to function on a daily basis and feel that they have become lifestyle limiting.  1. Bilateral lower extremity edema - New In an effort to gain control of the patient's severely edematous lower extremity I will place him in 3 layer zinc oxide Unna wraps to the bilateral lower extremity changed weekly for approximately 1 month Once I have gain control of his edema I would transition him to medical grade 1 compression socks I have reviewed the importance of appropriate elevation as heart level or higher as much as possible The patient was encouraged to remain active The patient is to follow-up in 1 month and undergo a bilateral venous duplex to rule out any contributing venous disease The patient expresses understanding  - VAS [place] LOWER EXTREMITY VENOUS REFLUX; Future  2. Lower extremity pain, bilateral - New Patient has multiple risk factors for peripheral artery disease And is experiencing claudication-like symptoms to the bilateral calves Unable to palpate pedal pulses on exam I will bring the patient back to undergo a bilateral ABI to rule out any contributing peripheral artery disease  - VAS [place] ABI WITH/WO TBI; Future  3. Essential hypertension - Stable Encouraged good control as its slows the progression of atherosclerotic disease  4. Hyperlipidemia, unspecified  hyperlipidemia type - Stable Encouraged good control as its slows the progression of atherosclerotic disease  Current Outpatient Medications on File Prior to Visit  Medication Sig Dispense Refill  . albuterol (VENTOLIN HFA) 108 (90 BASE) MCG/ACT inhaler Inhale 2 puffs into the lungs every 4 (four) hours as needed.     Marland Kitchen amLODipine-benazepril (LOTREL) 5-20 MG capsule Take by mouth.    . Aspirin-Acetaminophen-Caffeine (EXCEDRIN MIGRAINE PO) Take by mouth as needed.    . beclomethasone (QVAR) 40 MCG/ACT inhaler Inhale into the lungs.    . cetirizine (ZYRTEC) 10 MG tablet Take 10 mg by mouth.    . Coenzyme Q10 (CO Q-10) 200 MG CAPS Take 1 capsule by mouth daily.    . DOCOSAHEXAENOIC ACID PO Take by mouth.    . EPINEPHrine (EPIPEN IJ) Inject 1 Dose as directed as needed.    . fluticasone (FLONASE) 50 MCG/ACT nasal spray 1 SPRAY PER NARES PRN  5  . Multiple Vitamin (MULTI-VITAMINS) TABS Take by mouth.    . Nutritional Supplements (QUINOA Patrick & HEMP) LIQD Take 1 drop by mouth daily.    . Plant Sterols and Stanols (CHOLEST OFF PO) Take 2 tablets by mouth daily.    . ranitidine (ZANTAC) 150 MG tablet Take by mouth.    . sildenafil (REVATIO) 20 MG tablet Take 1 tablet (20 mg total) by mouth daily as needed.  11  . sildenafil (VIAGRA) 100 MG tablet Take 100 mg by mouth as needed.     . testosterone cypionate (DEPOTESTOSTERONE CYPIONATE) 200 MG/ML injection INJECT 1 ML (200 MG TOTAL) INTO THE MUSCLE EVERY FOURTEEN (14) DAYS.  5  . vitamin E (VITAMIN E) 400 UNIT capsule Take 400 Units by mouth daily.    . vitamin E 400 UNIT capsule Take by mouth.    . Butalbital-APAP-Caffeine (FIORICET PO) Take 1 tablet by mouth as needed.    . fenofibrate (TRICOR) 145 MG tablet Take 145 mg by mouth every morning.     Marland Kitchen glucosamine-chondroitin 500-400 MG tablet Take 1 tablet by mouth 2 (two) times daily.    Marland Kitchen  mupirocin cream (BACTROBAN) 2 % Apply to affected area 3 times daily (Patient not taking: Reported on 12/03/2017)  30 g 0  . ondansetron (ZOFRAN) 4 MG tablet Take 1 tablet (4 mg total) by mouth every 8 (eight) hours as needed for nausea or vomiting. (Patient not taking: Reported on 12/03/2017) 21 tablet 0  . pantoprazole (PROTONIX) 40 MG tablet Take 40 mg by mouth every morning.     . sulfamethoxazole-trimethoprim (BACTRIM DS,SEPTRA DS) 800-160 MG tablet Take 1 tablet by mouth 2 (two) times daily. (Patient not taking: Reported on 12/03/2017) 14 tablet 0   No current facility-administered medications on file prior to visit.    There are no Patient Instructions on file for this visit. No follow-ups on file.  Patrick Dols A Haward Pope, PA-C

## 2017-12-10 ENCOUNTER — Encounter (INDEPENDENT_AMBULATORY_CARE_PROVIDER_SITE_OTHER): Payer: Self-pay | Admitting: Vascular Surgery

## 2017-12-10 ENCOUNTER — Ambulatory Visit (INDEPENDENT_AMBULATORY_CARE_PROVIDER_SITE_OTHER): Payer: BLUE CROSS/BLUE SHIELD | Admitting: Vascular Surgery

## 2017-12-10 VITALS — BP 117/67 | HR 80 | Resp 16 | Ht 72.0 in | Wt 315.6 lb

## 2017-12-10 DIAGNOSIS — R600 Localized edema: Secondary | ICD-10-CM

## 2017-12-10 NOTE — Progress Notes (Signed)
History of Present Illness  There is no documented history at this time  Assessments & Plan   There are no diagnoses linked to this encounter.    Additional instructions  Subjective:  Patient presents with venous ulcer of the Bilateral lower extremity.    Procedure:  3 layer unna wrap was placed Bilateral lower extremity.   Plan:   Follow up in one week.  

## 2017-12-17 ENCOUNTER — Ambulatory Visit (INDEPENDENT_AMBULATORY_CARE_PROVIDER_SITE_OTHER): Payer: BLUE CROSS/BLUE SHIELD | Admitting: Vascular Surgery

## 2017-12-17 ENCOUNTER — Encounter (INDEPENDENT_AMBULATORY_CARE_PROVIDER_SITE_OTHER): Payer: Self-pay

## 2017-12-17 VITALS — BP 124/85 | HR 73 | Resp 17 | Ht 71.0 in | Wt 312.0 lb

## 2017-12-17 DIAGNOSIS — R600 Localized edema: Secondary | ICD-10-CM | POA: Diagnosis not present

## 2017-12-17 NOTE — Progress Notes (Signed)
History of Present Illness  There is no documented history at this time  Assessments & Plan   There are no diagnoses linked to this encounter.    Additional instructions  Subjective:  Patient presents with venous ulcer of the Bilateral lower extremity.    Procedure:  3 layer unna wrap was placed Bilateral lower extremity.   Plan:   Follow up in one week.  

## 2017-12-24 ENCOUNTER — Ambulatory Visit (INDEPENDENT_AMBULATORY_CARE_PROVIDER_SITE_OTHER): Payer: BLUE CROSS/BLUE SHIELD | Admitting: Vascular Surgery

## 2017-12-24 ENCOUNTER — Encounter (INDEPENDENT_AMBULATORY_CARE_PROVIDER_SITE_OTHER): Payer: Self-pay

## 2017-12-24 VITALS — BP 114/77 | HR 73 | Resp 17 | Ht 71.0 in | Wt 310.0 lb

## 2017-12-24 DIAGNOSIS — R600 Localized edema: Secondary | ICD-10-CM | POA: Diagnosis not present

## 2017-12-24 NOTE — Progress Notes (Signed)
History of Present Illness  There is no documented history at this time  Assessments & Plan   There are no diagnoses linked to this encounter.    Additional instructions  Subjective:  Patient presents with venous ulcer of the Bilateral lower extremity.    Procedure:  3 layer unna wrap was placed Bilateral lower extremity.   Plan:   Follow up in one week.  

## 2017-12-25 ENCOUNTER — Encounter (INDEPENDENT_AMBULATORY_CARE_PROVIDER_SITE_OTHER): Payer: Self-pay | Admitting: Vascular Surgery

## 2017-12-31 ENCOUNTER — Telehealth (INDEPENDENT_AMBULATORY_CARE_PROVIDER_SITE_OTHER): Payer: Self-pay

## 2017-12-31 NOTE — Telephone Encounter (Signed)
The patient should have unna wraps changed every 6 to 7 days.  I am unsure as to why he has it on for more than 7 days.  It should be changed weekly.  The patient can take a Benadryl for the itching.  If he feels strongly, he can remove the boot and make an appointment to have another one placed.  If he removes the boot, I strongly suggest he places a compression sock and continue elevating his legs as he will start to swell.

## 2017-12-31 NOTE — Telephone Encounter (Signed)
Patient called to inform us that he has had his Unna boot on now for 7 days, and he would like to know is he could remove it because of all of the excessive itching?  He has an appointment here in the office on Wednesday for ultrasounds and a Unna check.

## 2018-01-02 ENCOUNTER — Ambulatory Visit (INDEPENDENT_AMBULATORY_CARE_PROVIDER_SITE_OTHER): Payer: BLUE CROSS/BLUE SHIELD | Admitting: Vascular Surgery

## 2018-01-02 ENCOUNTER — Encounter (INDEPENDENT_AMBULATORY_CARE_PROVIDER_SITE_OTHER): Payer: Self-pay | Admitting: Vascular Surgery

## 2018-01-02 ENCOUNTER — Ambulatory Visit (INDEPENDENT_AMBULATORY_CARE_PROVIDER_SITE_OTHER): Payer: BLUE CROSS/BLUE SHIELD

## 2018-01-02 VITALS — BP 119/76 | HR 65 | Resp 17 | Ht 71.0 in | Wt 311.0 lb

## 2018-01-02 DIAGNOSIS — I872 Venous insufficiency (chronic) (peripheral): Secondary | ICD-10-CM | POA: Diagnosis not present

## 2018-01-02 DIAGNOSIS — I890 Lymphedema, not elsewhere classified: Secondary | ICD-10-CM

## 2018-01-02 DIAGNOSIS — M79605 Pain in left leg: Secondary | ICD-10-CM

## 2018-01-02 DIAGNOSIS — M79604 Pain in right leg: Secondary | ICD-10-CM | POA: Diagnosis not present

## 2018-01-02 DIAGNOSIS — R600 Localized edema: Secondary | ICD-10-CM | POA: Diagnosis not present

## 2018-01-02 NOTE — Progress Notes (Signed)
Subjective:    Patient ID: Patrick Burns, male    DOB: 1961-02-12, 57 y.o.   MRN: [id number] with a chief complaint of vascular studies follow-up.  The patient presents today to review vascular studies.  The patient was last seen on December 03, 2017.  Over the last 4 weeks the patient has been treated with 3 layer zinc oxide Unna wraps to the bilateral lower extremity to help control his edema.  The patient presents today with minimal improvement to the edema discomfort he has been experiencing to his lower extremity.  The patient notes that his symptoms have become lifestyle limiting.  The patient's symptoms have progressed to the point that he is unable to function on a daily basis.  The patient underwent a bilateral lower extremity ABI which is notable for triphasic tibials bilaterally with normal toe brachial indices bilaterally.  Right ABI 1.27.  Left ABI 1.18.  There is no previous study for comparison.  The patient underwent a bilateral lower extremity venous duplex which was notable for venous reflux noted to the left common femoral vein.  No evidence of deep vein thrombosis or superficial thrombophlebitis in the bilateral lower extremity.  The patient denies any fever, nausea vomiting.  Review of Systems  Constitutional: Negative.   HENT: Negative.   Eyes: Negative.   Respiratory: Negative.   Cardiovascular:       Chronic venous insufficiency Lymphedema  Gastrointestinal: Negative.   Endocrine: Negative.   Genitourinary: Negative.   Musculoskeletal: Negative.   Skin: Negative.   Allergic/Immunologic: Negative.   Neurological: Negative.   Hematological: Negative.   Psychiatric/Behavioral: Negative.       Objective:   Physical Exam  Constitutional: He is oriented to person, place, and time. He appears well-developed and well-nourished. No distress.  HENT:  Head: Normocephalic and atraumatic.  Right Ear: External ear normal.  Left Ear: External ear normal.  Eyes: Pupils are  equal, round, and reactive to light. Conjunctivae and EOM are normal.  Neck: Normal range of motion.  Cardiovascular: Normal rate, regular rhythm, normal heart sounds and intact distal pulses.  Pulses:      Radial pulses are 2+ on the right side, and 2+ on the left side.  Hard to palpate pedal pulses due to body habitus and edema  Pulmonary/Chest: Effort normal and breath sounds normal.  Musculoskeletal: Normal range of motion. He exhibits edema (Moderate nonpitting edema bilaterally).  Neurological: He is alert and oriented to person, place, and time.  Skin: Skin is warm and dry. He is not diaphoretic.  Minimal less than 1 cm scattered varicosities noted to the bilateral lower extremity.  There is mild stasis dermatitis bilaterally.  There is no fibrosis, cellulitis or active ulcerations at this time.  Psychiatric: He has a normal mood and affect. His behavior is normal. Judgment and thought content normal.  Vitals reviewed.  BP 119/76 (BP Location: Right Arm)   Pulse 65   Resp 17   Ht 5\' 11"  (1.803 m)   Wt (!) 311 lb (141.1 kg)   BMI 43.38 kg/m   Past Medical History:  Diagnosis Date  . Asthma   . Blood dyscrasia    "BLOOD IS THICK"  . Dyspnea on exertion   . GERD (gastroesophageal reflux disease)   . Headache    MIGRAINES  . Hypogonadism male   . Leaky heart valve   . Metastatic cancer ([place]) 2006   Rectum --> prostate --> bladder/chemo and radiation/ [facility]/squamous cell carcinoma anal rectum  area  . Peripheral edema   . Sleep apnea    NOT USING CPAP CURRENTLY  . Umbilical hernia    Social History   Socioeconomic History  . Marital status: Married    Spouse name: Not on file  . Number of children: Not on file  . Years of education: Not on file  . Highest education level: Not on file  Occupational History  . Not on file  Social Needs  . Financial resource strain: Not on file  . Food insecurity:    Worry: Not on file    Inability: Not on file  . Transportation  needs:    Medical: Not on file    Non-medical: Not on file  Tobacco Use  . Smoking status: Never Smoker  . Smokeless tobacco: Current User    Types: Snuff  Substance and Sexual Activity  . Alcohol use: Yes    Alcohol/week: 0.0 oz    Comment: OCC SIP MOONSHINE  . Drug use: No  . Sexual activity: Not on file  Lifestyle  . Physical activity:    Days per week: Not on file    Minutes per session: Not on file  . Stress: Not on file  Relationships  . Social connections:    Talks on phone: Not on file    Gets together: Not on file    Attends religious service: Not on file    Active member of club or organization: Not on file    Attends meetings of clubs or organizations: Not on file    Relationship status: Not on file  . Intimate partner violence:    Fear of current or ex partner: Not on file    Emotionally abused: Not on file    Physically abused: Not on file    Forced sexual activity: Not on file  Other Topics Concern  . Not on file  Social History Narrative  . Not on file   Past Surgical History:  Procedure Laterality Date  . COLONOSCOPY  2011   [facility]  . KNEE SURGERY    . TONSILLECTOMY    . UMBILICAL HERNIA REPAIR N/A 05/06/2015   Procedure: HERNIA REPAIR UMBILICAL ADULT;  Surgeon: Christene Lye, MD;  Location: [facility] ORS;  Service: General;  Laterality: N/A;   Family History  Problem Relation Age of Onset  . Diabetes Father    Allergies  Allergen Reactions  . Bee Venom Anaphylaxis  . Ibuprofen Anaphylaxis  . Biaxin [Clarithromycin] Nausea And Vomiting  . Mango Flavor Swelling    LIPS      Assessment & Plan:  The patient presents today to review vascular studies.  The patient was last seen on December 03, 2017.  Over the last 4 weeks the patient has been treated with 3 layer zinc oxide Unna wraps to the bilateral lower extremity to help control his edema.  The patient presents today with minimal improvement to the edema discomfort he has been experiencing to his  lower extremity.  The patient notes that his symptoms have become lifestyle limiting.  The patient's symptoms have progressed to the point that he is unable to function on a daily basis.  The patient underwent a bilateral lower extremity ABI which is notable for triphasic tibials bilaterally with normal toe brachial indices bilaterally.  Right ABI 1.27.  Left ABI 1.18.  There is no previous study for comparison.  The patient underwent a bilateral lower extremity venous duplex which was notable for venous reflux noted to the  left common femoral vein.  No evidence of deep vein thrombosis or superficial thrombophlebitis in the bilateral lower extremity.  The patient denies any fever, nausea vomiting.  1. Chronic venous insufficiency - New Patient was found to have venous reflux in the left common femoral vein. Unfortunately due to the location of his reflux being in the deep system he is not a candidate for laser ablation or sclerotherapy. The patient was encouraged to continue engaging conservative therapy including wearing medical grade 1 compression socks, elevation and remaining active to control his symptoms.  2. Lymphedema - New Despite conservative treatments including exercise, elevation and class I compression socks, 3 layer zinc oxide Unna wraps the patient still presents with stage I lymphedema. The patient would greatly benefit from the added therapy of a lymphedema pump Applied to the patient's insurance The patient is to follow-up in 3 months so I can assess his progress with conservative therapy and the added benefit of a lymphedema pump The patient is to continue getting in conservative therapy in the meantime The patient was instructed to call the office in the interim if any worsening edema or ulcerations to the legs, feet or toes occurs. The patient expresses their understanding  Current Outpatient Medications on File Prior to Visit  Medication Sig Dispense Refill  . albuterol  (VENTOLIN HFA) 108 (90 BASE) MCG/ACT inhaler Inhale 2 puffs into the lungs every 4 (four) hours as needed.     Marland Kitchen amLODipine-benazepril (LOTREL) 5-20 MG capsule Take by mouth.    . Aspirin-Acetaminophen-Caffeine (EXCEDRIN MIGRAINE PO) Take by mouth as needed.    . beclomethasone (QVAR) 40 MCG/ACT inhaler Inhale into the lungs.    . Butalbital-APAP-Caffeine (FIORICET PO) Take 1 tablet by mouth as needed.    . cetirizine (ZYRTEC) 10 MG tablet Take 10 mg by mouth.    . Coenzyme Q10 (CO Q-10) 200 MG CAPS Take 1 capsule by mouth daily.    . DOCOSAHEXAENOIC ACID PO Take by mouth.    . EPINEPHrine (EPIPEN IJ) Inject 1 Dose as directed as needed.    . fenofibrate (TRICOR) 145 MG tablet Take 145 mg by mouth every morning.     . fluticasone (FLONASE) 50 MCG/ACT nasal spray 1 SPRAY PER NARES PRN  5  . glucosamine-chondroitin 500-400 MG tablet Take 1 tablet by mouth 2 (two) times daily.    . Multiple Vitamin (MULTI-VITAMINS) TABS Take by mouth.    . mupirocin cream (BACTROBAN) 2 % Apply to affected area 3 times daily 30 g 0  . Nutritional Supplements (QUINOA KALE & HEMP) LIQD Take 1 drop by mouth daily.    . ondansetron (ZOFRAN) 4 MG tablet Take 1 tablet (4 mg total) by mouth every 8 (eight) hours as needed for nausea or vomiting. 21 tablet 0  . pantoprazole (PROTONIX) 40 MG tablet Take 40 mg by mouth every morning.     . Plant Sterols and Stanols (CHOLEST OFF PO) Take 2 tablets by mouth daily.    . podofilox (CONDYLOX) 0.5 % gel Apply topically.    Marland Kitchen QVAR REDIHALER 40 MCG/ACT inhaler     . ranitidine (ZANTAC) 150 MG tablet Take by mouth.    . sildenafil (REVATIO) 20 MG tablet Take 1 tablet (20 mg total) by mouth daily as needed.  11  . sildenafil (VIAGRA) 100 MG tablet Take 100 mg by mouth as needed.     . sulfamethoxazole-trimethoprim (BACTRIM DS,SEPTRA DS) 800-160 MG tablet Take 1 tablet by mouth 2 (two) times daily. [place]  tablet 0  . testosterone cypionate (DEPOTESTOSTERONE CYPIONATE) 200 MG/ML injection  INJECT 1 ML (200 MG TOTAL) INTO THE MUSCLE EVERY FOURTEEN (14) DAYS.  5  . tiZANidine (ZANAFLEX) 2 MG tablet TAKE 1 TABLET ORALLY TWICE DAILY AS NEEDED FOR MUSCLE SPASMS  0  . vitamin E (VITAMIN E) 400 UNIT capsule Take 400 Units by mouth daily.    . vitamin E 400 UNIT capsule Take by mouth.     No current facility-administered medications on file prior to visit.    There are no Patient Instructions on file for this visit. Return in about 3 months (around 04/04/2018), or if symptoms worsen or fail to improve.  KIMBERLY A STEGMAYER, PA-C

## 2018-04-18 ENCOUNTER — Ambulatory Visit (INDEPENDENT_AMBULATORY_CARE_PROVIDER_SITE_OTHER): Payer: Self-pay | Admitting: Vascular Surgery

## 2018-05-07 ENCOUNTER — Emergency Department
Admission: EM | Admit: 2018-05-07 | Discharge: 2018-05-07 | Disposition: A | Discharge status: Home / Self Care | Payer: Self-pay | Attending: Emergency Medicine | Admitting: Emergency Medicine

## 2018-05-07 ENCOUNTER — Telehealth: Payer: Self-pay | Admitting: Emergency Medicine

## 2018-05-07 ENCOUNTER — Other Ambulatory Visit: Payer: Self-pay

## 2018-05-07 DIAGNOSIS — Y939 Activity, unspecified: Secondary | ICD-10-CM | POA: Insufficient documentation

## 2018-05-07 DIAGNOSIS — Y929 Unspecified place or not applicable: Secondary | ICD-10-CM | POA: Insufficient documentation

## 2018-05-07 DIAGNOSIS — Y999 Unspecified external cause status: Secondary | ICD-10-CM | POA: Insufficient documentation

## 2018-05-07 DIAGNOSIS — S31139A Puncture wound of abdominal wall without foreign body, unspecified quadrant without penetration into peritoneal cavity, initial encounter: Secondary | ICD-10-CM | POA: Insufficient documentation

## 2018-05-07 DIAGNOSIS — Z5321 Procedure and treatment not carried out due to patient leaving prior to being seen by health care provider: Secondary | ICD-10-CM | POA: Insufficient documentation

## 2018-05-07 DIAGNOSIS — W268XXA Contact with other sharp object(s), not elsewhere classified, initial encounter: Secondary | ICD-10-CM | POA: Insufficient documentation

## 2018-05-07 NOTE — Telephone Encounter (Signed)
Called patient due to lwot to inquire about condition and follow up plans. Left message.   

## 2018-05-07 NOTE — ED Triage Notes (Signed)
Patient coming into night for a small puncture to abdomen with a piece of metal as he was scrapping metal tonight. Had a hernia repair 2 year ago and states he is just nervous and wants to get checked out.

## 2018-05-07 NOTE — ED Notes (Signed)
Pt states he is tired of waiting and is going to follow up with his PCP tomorrow morning if his pain persists.

## 2018-05-16 ENCOUNTER — Ambulatory Visit (INDEPENDENT_AMBULATORY_CARE_PROVIDER_SITE_OTHER): Payer: Self-pay | Admitting: Vascular Surgery

## 2018-06-29 ENCOUNTER — Emergency Department: Payer: BLUE CROSS/BLUE SHIELD

## 2018-06-29 ENCOUNTER — Encounter: Payer: Self-pay | Admitting: Emergency Medicine

## 2018-06-29 ENCOUNTER — Emergency Department
Admission: EM | Admit: 2018-06-29 | Discharge: 2018-06-30 | Disposition: A | Discharge status: Home / Self Care | Payer: BLUE CROSS/BLUE SHIELD | Attending: Emergency Medicine | Admitting: Emergency Medicine

## 2018-06-29 DIAGNOSIS — Z8546 Personal history of malignant neoplasm of prostate: Secondary | ICD-10-CM | POA: Diagnosis not present

## 2018-06-29 DIAGNOSIS — M7918 Myalgia, other site: Secondary | ICD-10-CM | POA: Diagnosis not present

## 2018-06-29 DIAGNOSIS — Z923 Personal history of irradiation: Secondary | ICD-10-CM | POA: Insufficient documentation

## 2018-06-29 DIAGNOSIS — I10 Essential (primary) hypertension: Secondary | ICD-10-CM | POA: Diagnosis not present

## 2018-06-29 DIAGNOSIS — R0602 Shortness of breath: Secondary | ICD-10-CM | POA: Insufficient documentation

## 2018-06-29 DIAGNOSIS — F17228 Nicotine dependence, chewing tobacco, with other nicotine-induced disorders: Secondary | ICD-10-CM | POA: Insufficient documentation

## 2018-06-29 DIAGNOSIS — Z9221 Personal history of antineoplastic chemotherapy: Secondary | ICD-10-CM | POA: Insufficient documentation

## 2018-06-29 DIAGNOSIS — R61 Generalized hyperhidrosis: Secondary | ICD-10-CM | POA: Diagnosis not present

## 2018-06-29 DIAGNOSIS — Z8551 Personal history of malignant neoplasm of bladder: Secondary | ICD-10-CM | POA: Insufficient documentation

## 2018-06-29 DIAGNOSIS — J111 Influenza due to unidentified influenza virus with other respiratory manifestations: Secondary | ICD-10-CM | POA: Diagnosis not present

## 2018-06-29 DIAGNOSIS — Z85048 Personal history of other malignant neoplasm of rectum, rectosigmoid junction, and anus: Secondary | ICD-10-CM | POA: Insufficient documentation

## 2018-06-29 DIAGNOSIS — R05 Cough: Secondary | ICD-10-CM | POA: Insufficient documentation

## 2018-06-29 DIAGNOSIS — R509 Fever, unspecified: Secondary | ICD-10-CM | POA: Diagnosis present

## 2018-06-29 DIAGNOSIS — R69 Illness, unspecified: Secondary | ICD-10-CM

## 2018-06-29 LAB — CBC WITH DIFFERENTIAL/PLATELET
Abs Immature Granulocytes: 0.06 10*3/uL (ref 0.00–0.07)
Basophils Absolute: 0 10*3/uL (ref 0.0–0.1)
Basophils Relative: 1 %
EOS ABS: 0 10*3/uL (ref 0.0–0.5)
Eosinophils Relative: 1 %
HCT: 43.2 % (ref 39.0–52.0)
HEMOGLOBIN: 14.5 g/dL (ref 13.0–17.0)
Immature Granulocytes: 1 %
Lymphocytes Relative: 13 %
Lymphs Abs: 0.7 10*3/uL (ref 0.7–4.0)
MCH: 28.7 pg (ref 26.0–34.0)
MCHC: 33.6 g/dL (ref 30.0–36.0)
MCV: 85.4 fL (ref 80.0–100.0)
Monocytes Absolute: 0.4 10*3/uL (ref 0.1–1.0)
Monocytes Relative: 8 %
NEUTROS ABS: 4.2 10*3/uL (ref 1.7–7.7)
NEUTROS PCT: 76 %
NRBC: 0 % (ref 0.0–0.2)
PLATELETS: 168 10*3/uL (ref 150–400)
RBC: 5.06 MIL/uL (ref 4.22–5.81)
RDW: 13.1 % (ref 11.5–15.5)
WBC: 5.4 10*3/uL (ref 4.0–10.5)

## 2018-06-29 LAB — BASIC METABOLIC PANEL
ANION GAP: 9 (ref 5–15)
BUN: 10 mg/dL (ref 6–20)
CO2: 26 mmol/L (ref 22–32)
Calcium: 8.8 mg/dL — ABNORMAL LOW (ref 8.9–10.3)
Chloride: 102 mmol/L (ref 98–111)
Creatinine, Ser: 1.13 mg/dL (ref 0.61–1.24)
GFR calc non Af Amer: >60 mL/min (ref >60)
Glucose, Bld: 169 mg/dL — ABNORMAL HIGH (ref 70–99)
POTASSIUM: 3.4 mmol/L — AB (ref 3.5–5.1)
SODIUM: 137 mmol/L (ref 135–145)

## 2018-06-29 LAB — BRAIN NATRIURETIC PEPTIDE: B NATRIURETIC PEPTIDE 5: 14 pg/mL (ref 0.0–100.0)

## 2018-06-29 LAB — TROPONIN I: Troponin I: <0.03 ng/mL (ref <0.03)

## 2018-06-29 LAB — LACTIC ACID, PLASMA: Lactic Acid, Venous: 1 mmol/L (ref 0.5–1.9)

## 2018-06-29 LAB — INFLUENZA PANEL BY PCR (TYPE A & B)
INFLAPCR: NEGATIVE
Influenza B By PCR: NEGATIVE

## 2018-06-29 MED ORDER — OSELTAMIVIR PHOSPHATE 75 MG PO CAPS: 75.0000 mg | ORAL_CAPSULE | Freq: Two times a day (BID) | ORAL | 0 refills | Status: AC

## 2018-06-29 MED ORDER — SODIUM CHLORIDE 0.9 % IV SOLN
Freq: Once | INTRAVENOUS | Status: AC
  Administered 2018-06-29: 23:00:00 via INTRAVENOUS

## 2018-06-29 MED ORDER — OSELTAMIVIR PHOSPHATE 75 MG PO CAPS
75.0000 mg | ORAL_CAPSULE | Freq: Once | ORAL | Status: AC
  Administered 2018-06-30: 75 mg via ORAL
  Filled 2018-06-29: qty 1

## 2018-06-29 MED ORDER — HYDROCOD POLST-CPM POLST ER 10-8 MG/5ML PO SUER: 5.0000 mL | Freq: Two times a day (BID) | ORAL | 0 refills | Status: DC

## 2018-06-29 MED ORDER — MORPHINE SULFATE (PF) 4 MG/ML IV SOLN
4.0000 mg | Freq: Once | INTRAVENOUS | Status: AC
  Administered 2018-06-30: 4 mg via INTRAVENOUS
  Filled 2018-06-29: qty 1

## 2018-06-29 NOTE — ED Notes (Signed)
Patient transported to X-ray 

## 2018-06-29 NOTE — ED Triage Notes (Signed)
Patient states that has had a fever of 101 and cough times two days. Patient states that he took Excedrin at 19:00. Patient states he had a fever of 103 at that time. Patient diaphoretic in triage.

## 2018-06-29 NOTE — ED Triage Notes (Signed)
Pt reports fever over the last few days with non-productive cough; temp before arrival was 103; at 7pm he took nyquil; says he's having sweats and cold chills; ambulatory with steady gait; talking in complete coherent sentences;

## 2018-06-29 NOTE — ED Provider Notes (Signed)
[facility] Emergency Department Provider Note       Time seen: ----------------------------------------- 10:01 PM on 06/29/2018 -----------------------------------------   I have reviewed the triage vital signs and the nursing notes.  HISTORY   Chief Complaint Fever and Cough    HPI Patrick Burns is a 57 y.o. male with a history of asthma, blood dyscrasia, dyspnea on exertion, GERD, metastatic cancer, peripheral edema who presents to the ED for fever and cough for 2 days.  Patient states he took Excedrin at home about 7 PM.  He had a fever of 103 at that time.  On arrival his temperature is 101 and he is diaphoretic.  He also reports sweats and chills and body aches.  Past Medical History:  Diagnosis Date  . Asthma   . Blood dyscrasia    "BLOOD IS THICK"  . Dyspnea on exertion   . GERD (gastroesophageal reflux disease)   . Headache    MIGRAINES  . Hypogonadism male   . Leaky heart valve   . Metastatic cancer ([place]) 2006   Rectum --> prostate --> bladder/chemo and radiation/ [facility]/squamous cell carcinoma anal rectum area  . Peripheral edema   . Sleep apnea    NOT USING CPAP CURRENTLY  . Umbilical hernia     Patient Active Problem List   Diagnosis Date Noted  . Chronic venous insufficiency 01/02/2018  . Lymphedema 01/02/2018  . Essential hypertension 12/03/2017  . Bilateral lower extremity edema 12/03/2017  . Lower extremity pain, bilateral 12/03/2017  . Hyperlipidemia 12/03/2017  . Umbilical hernia without obstruction and without gangrene 04/22/2015  . Diastasis recti 04/22/2015    Past Surgical History:  Procedure Laterality Date  . COLONOSCOPY  2011   [facility]  . KNEE SURGERY    . TONSILLECTOMY    . UMBILICAL HERNIA REPAIR N/A 05/06/2015   Procedure: HERNIA REPAIR UMBILICAL ADULT;  Surgeon: Christene Lye, MD;  Location: [facility] ORS;  Service: General;  Laterality: N/A;    Allergies Bee venom; Ibuprofen; Biaxin  [clarithromycin]; and Mango flavor  Social History Social History   Tobacco Use  . Smoking status: Never Smoker  . Smokeless tobacco: Current User    Types: Snuff  Substance Use Topics  . Alcohol use: Yes    Alcohol/week: 0.0 standard drinks    Comment: OCC SIP MOONSHINE  . Drug use: No   Review of Systems Constitutional: Positive for fevers, chills, body aches Cardiovascular: Negative for chest pain. Respiratory: Positive for shortness of breath with cough Gastrointestinal: Negative for abdominal pain, vomiting and diarrhea. Musculoskeletal: Negative for back pain. Skin: Positive for diaphoresis Neurological: Negative for headaches, focal weakness or numbness.  All systems negative/normal/unremarkable except as stated in the HPI  ____________________________________________   PHYSICAL EXAM:  VITAL SIGNS: ED Triage Vitals  Enc Vitals Group     BP 06/29/18 2156 115/73     Pulse Rate 06/29/18 2156 (!) 114     Resp 06/29/18 2156 (!) 24     Temp 06/29/18 2156 (!) 101 F (38.3 C)     Temp Source 06/29/18 2156 Oral     SpO2 06/29/18 2156 93 %     Weight 06/29/18 2157 (!) 325 lb (147.4 kg)     Height 06/29/18 2157 6' (1.829 m)     Head Circumference --      Peak Flow --      Pain Score 06/29/18 2156 5     Pain Loc --      Pain  Edu? --      Excl. in [place]? --    Constitutional: Alert and oriented. Well appearing and in no distress. Eyes: Conjunctivae are normal. Normal extraocular movements. ENT   Head: Normocephalic and atraumatic.   Nose: No congestion/rhinnorhea.   Mouth/Throat: Mucous membranes are moist.   Neck: No stridor. Cardiovascular: Rapid rate, regular rhythm. No murmurs, rubs, or gallops. Respiratory: Normal respiratory effort without tachypnea nor retractions. Breath sounds are clear and equal bilaterally. No wheezes/rales/rhonchi. Gastrointestinal: Soft and nontender. Normal bowel sounds Musculoskeletal: Nontender with normal range of motion  in extremities. No lower extremity tenderness nor edema. Neurologic:  Normal speech and language. No gross focal neurologic deficits are appreciated.  Skin: Profuse diaphoresis is noted Psychiatric: Mood and affect are normal. Speech and behavior are normal.  ____________________________________________  EKG: Interpreted by me.  Sinus tachycardia with a rate of 108 bpm, left axis deviation, normal QT.  ____________________________________________  ED COURSE:  As part of my medical decision making, I reviewed the following data within the [place] History obtained from family if available, nursing notes, old chart and ekg, as well as notes from prior ED visits. Patient presented for flulike symptoms, we will assess with labs and imaging as indicated at this time.   Procedures ____________________________________________   LABS (pertinent positives/negatives)  Labs Reviewed  BASIC METABOLIC PANEL - Abnormal; Notable for the following components:      Result Value   Potassium 3.4 (*)    Glucose, Bld 169 (*)    Calcium 8.8 (*)    All other components within normal limits  CULTURE, BLOOD (ROUTINE X 2)  CULTURE, BLOOD (ROUTINE X 2)  CBC WITH DIFFERENTIAL/PLATELET  BRAIN NATRIURETIC PEPTIDE  TROPONIN I  INFLUENZA PANEL BY PCR (TYPE A & B)  LACTIC ACID, PLASMA    RADIOLOGY Images were viewed by me  Chest x-ray Is unremarkable ____________________________________________  DIFFERENTIAL DIAGNOSIS   Viral infection, pneumonia, influenza, sepsis  FINAL ASSESSMENT AND PLAN  Influenza-like illness   Plan: The patient had presented for flulike symptoms. Patient's labs were surprisingly normal. Patient's imaging did not reveal any acute process.  Patient will be treated symptomatically, given the degree of his symptoms and the similarity to influenza, I may place him on Tamiflu but will advise close outpatient follow-up with his doctor.   Laurence Aly, MD   Note: This note was generated in part or whole with voice recognition software. Voice recognition is usually quite accurate but there are transcription errors that can and very often do occur. I apologize for any typographical errors that were not detected and corrected.     Earleen Newport, MD 06/29/18 [phone]

## 2018-06-30 ENCOUNTER — Emergency Department: Payer: BLUE CROSS/BLUE SHIELD

## 2018-06-30 ENCOUNTER — Encounter: Payer: Self-pay | Admitting: Emergency Medicine

## 2018-06-30 ENCOUNTER — Emergency Department
Admission: EM | Admit: 2018-06-30 | Discharge: 2018-06-30 | Disposition: A | Discharge status: Home / Self Care | Payer: BLUE CROSS/BLUE SHIELD | Source: Home / Self Care | Attending: Emergency Medicine | Admitting: Emergency Medicine

## 2018-06-30 DIAGNOSIS — J111 Influenza due to unidentified influenza virus with other respiratory manifestations: Secondary | ICD-10-CM

## 2018-06-30 DIAGNOSIS — R69 Illness, unspecified: Principal | ICD-10-CM

## 2018-06-30 DIAGNOSIS — I10 Essential (primary) hypertension: Secondary | ICD-10-CM

## 2018-06-30 DIAGNOSIS — J45909 Unspecified asthma, uncomplicated: Secondary | ICD-10-CM | POA: Insufficient documentation

## 2018-06-30 DIAGNOSIS — Z85048 Personal history of other malignant neoplasm of rectum, rectosigmoid junction, and anus: Secondary | ICD-10-CM

## 2018-06-30 LAB — COMPREHENSIVE METABOLIC PANEL
ALT: 28 U/L (ref 0–44)
AST: 24 U/L (ref 15–41)
Albumin: 3.8 g/dL (ref 3.5–5.0)
Alkaline Phosphatase: 66 U/L (ref 38–126)
Anion gap: 9 (ref 5–15)
BILIRUBIN TOTAL: 1.2 mg/dL (ref 0.3–1.2)
BUN: 10 mg/dL (ref 6–20)
CHLORIDE: 101 mmol/L (ref 98–111)
CO2: 28 mmol/L (ref 22–32)
Calcium: 8.8 mg/dL — ABNORMAL LOW (ref 8.9–10.3)
Creatinine, Ser: 1.14 mg/dL (ref 0.61–1.24)
GFR calc Af Amer: >60 mL/min (ref >60)
GFR calc non Af Amer: >60 mL/min (ref >60)
GLUCOSE: 130 mg/dL — AB (ref 70–99)
POTASSIUM: 3.6 mmol/L (ref 3.5–5.1)
Sodium: 138 mmol/L (ref 135–145)
TOTAL PROTEIN: 7.4 g/dL (ref 6.5–8.1)

## 2018-06-30 LAB — URINALYSIS, COMPLETE (UACMP) WITH MICROSCOPIC
Bacteria, UA: NONE SEEN
Bilirubin Urine: NEGATIVE
Glucose, UA: NEGATIVE mg/dL
Hgb urine dipstick: NEGATIVE
KETONES UR: NEGATIVE mg/dL
Nitrite: NEGATIVE
PH: 6 (ref 5.0–8.0)
Protein, ur: NEGATIVE mg/dL
SPECIFIC GRAVITY, URINE: 1.012 (ref 1.005–1.030)
SQUAMOUS EPITHELIAL / LPF: NONE SEEN (ref 0–5)
WBC, UA: >50 WBC/hpf — ABNORMAL HIGH (ref 0–5)

## 2018-06-30 LAB — CBC WITH DIFFERENTIAL/PLATELET
Abs Immature Granulocytes: 0.07 10*3/uL (ref 0.00–0.07)
Basophils Absolute: 0.1 10*3/uL (ref 0.0–0.1)
Basophils Relative: 1 %
EOS ABS: 0 10*3/uL (ref 0.0–0.5)
Eosinophils Relative: 1 %
HEMATOCRIT: 44.2 % (ref 39.0–52.0)
Hemoglobin: 14.6 g/dL (ref 13.0–17.0)
IMMATURE GRANULOCYTES: 2 %
LYMPHS ABS: 0.9 10*3/uL (ref 0.7–4.0)
LYMPHS PCT: 19 %
MCH: 28.6 pg (ref 26.0–34.0)
MCHC: 33 g/dL (ref 30.0–36.0)
MCV: 86.5 fL (ref 80.0–100.0)
Monocytes Absolute: 0.4 10*3/uL (ref 0.1–1.0)
Monocytes Relative: 7 %
NRBC: 0 % (ref 0.0–0.2)
Neutro Abs: 3.3 10*3/uL (ref 1.7–7.7)
Neutrophils Relative %: 70 %
Platelets: 143 10*3/uL — ABNORMAL LOW (ref 150–400)
RBC: 5.11 MIL/uL (ref 4.22–5.81)
RDW: 13.2 % (ref 11.5–15.5)
WBC: 4.7 10*3/uL (ref 4.0–10.5)

## 2018-06-30 LAB — LACTIC ACID, PLASMA: Lactic Acid, Venous: 0.9 mmol/L (ref 0.5–1.9)

## 2018-06-30 MED ORDER — HYDROMORPHONE HCL 1 MG/ML IJ SOLN
1.0000 mg | Freq: Once | INTRAMUSCULAR | Status: AC
  Administered 2018-06-30: 1 mg via INTRAVENOUS
  Filled 2018-06-30: qty 1

## 2018-06-30 MED ORDER — ACETAMINOPHEN 325 MG PO TABS
650.0000 mg | ORAL_TABLET | Freq: Once | ORAL | Status: AC | PRN
  Administered 2018-06-30: 650 mg via ORAL
  Filled 2018-06-30: qty 2

## 2018-06-30 MED ORDER — OSELTAMIVIR PHOSPHATE 75 MG PO CAPS
75.0000 mg | ORAL_CAPSULE | Freq: Once | ORAL | Status: AC
  Administered 2018-06-30: 75 mg via ORAL
  Filled 2018-06-30: qty 1

## 2018-06-30 MED ORDER — CEPHALEXIN 250 MG PO CAPS: 250.0000 mg | ORAL_CAPSULE | Freq: Four times a day (QID) | ORAL | 0 refills | Status: AC

## 2018-06-30 MED ORDER — SODIUM CHLORIDE 0.9 % IV SOLN
Freq: Once | INTRAVENOUS | Status: AC
  Administered 2018-06-30: 17:00:00 via INTRAVENOUS

## 2018-06-30 MED ORDER — SODIUM CHLORIDE 0.9 % IV SOLN
1.0000 g | Freq: Once | INTRAVENOUS | Status: AC
  Administered 2018-06-30: 1 g via INTRAVENOUS
  Filled 2018-06-30: qty 10

## 2018-06-30 MED ORDER — ACETAMINOPHEN 325 MG PO TABS
ORAL_TABLET | ORAL | Status: AC
  Filled 2018-06-30: qty 2

## 2018-06-30 MED ORDER — ACETAMINOPHEN 325 MG PO TABS
650.0000 mg | ORAL_TABLET | Freq: Once | ORAL | Status: AC
  Administered 2018-06-30: 650 mg via ORAL
  Filled 2018-06-30: qty 2

## 2018-06-30 NOTE — ED Provider Notes (Signed)
[facility] Emergency Department Provider Note       Time seen: ----------------------------------------- 3:15 PM on 06/30/2018 -----------------------------------------   I have reviewed the triage vital signs and the nursing notes.  HISTORY   Chief Complaint Fever    HPI Patrick Burns is a 57 y.o. male with a history of asthma, GERD, migraines, metastatic cancer, peripheral edema who presents to the ED for fever this morning at 9 AM of 103.  Patient was seen by me last night and diagnosed with a flulike illness.  I did start him on Tamiflu but he has not taken the first dose yet.  Patient states he feels miserable with body aches and profuse diaphoresis.  Past Medical History:  Diagnosis Date  . Asthma   . Blood dyscrasia    "BLOOD IS THICK"  . Dyspnea on exertion   . GERD (gastroesophageal reflux disease)   . Headache    MIGRAINES  . Hypogonadism male   . Leaky heart valve   . Metastatic cancer ([place]) 2006   Rectum --> prostate --> bladder/chemo and radiation/ [facility]/squamous cell carcinoma anal rectum area  . Peripheral edema   . Sleep apnea    NOT USING CPAP CURRENTLY  . Umbilical hernia     Patient Active Problem List   Diagnosis Date Noted  . Chronic venous insufficiency 01/02/2018  . Lymphedema 01/02/2018  . Essential hypertension 12/03/2017  . Bilateral lower extremity edema 12/03/2017  . Lower extremity pain, bilateral 12/03/2017  . Hyperlipidemia 12/03/2017  . Umbilical hernia without obstruction and without gangrene 04/22/2015  . Diastasis recti 04/22/2015    Past Surgical History:  Procedure Laterality Date  . COLONOSCOPY  2011   [facility]  . KNEE SURGERY    . TONSILLECTOMY    . UMBILICAL HERNIA REPAIR N/A 05/06/2015   Procedure: HERNIA REPAIR UMBILICAL ADULT;  Surgeon: Christene Lye, MD;  Location: [facility] ORS;  Service: General;  Laterality: N/A;    Allergies Bee venom; Ibuprofen; Biaxin [clarithromycin]; and  Mango flavor  Social History Social History   Tobacco Use  . Smoking status: Never Smoker  . Smokeless tobacco: Current User    Types: Snuff  Substance Use Topics  . Alcohol use: Yes    Alcohol/week: 0.0 standard drinks    Comment: OCC SIP MOONSHINE  . Drug use: No   Review of Systems Constitutional: Positive for fevers, chills, body aches Cardiovascular: Negative for chest pain. Respiratory: Negative for shortness of breath.  Positive for cough Gastrointestinal: Negative for abdominal pain, vomiting and diarrhea. Musculoskeletal: Positive for joint pains and arthralgia Skin: Positive for diaphoresis Neurological: Negative for headaches, focal weakness or numbness.  All systems negative/normal/unremarkable except as stated in the HPI  ____________________________________________   PHYSICAL EXAM:  VITAL SIGNS: ED Triage Vitals  Enc Vitals Group     BP 06/30/18 1221 119/70     Pulse Rate 06/30/18 1221 (!) 105     Resp 06/30/18 1221 20     Temp 06/30/18 1221 (!) 101 F (38.3 C)     Temp Source 06/30/18 1221 Oral     SpO2 06/30/18 1221 93 %     Weight 06/30/18 1223 (!) 320 lb (145.2 kg)     Height 06/30/18 1223 6' (1.829 m)     Head Circumference --      Peak Flow --      Pain Score 06/30/18 1222 6     Pain Loc --      Pain Edu? --  Excl. in [place]? --    Constitutional: Alert and oriented. Well appearing and in no distress. Eyes: Conjunctivae are normal. Normal extraocular movements. ENT   Head: Normocephalic and atraumatic.   Nose: No congestion/rhinnorhea.   Mouth/Throat: Mucous membranes are moist.   Neck: No stridor. Cardiovascular: Normal rate, regular rhythm. No murmurs, rubs, or gallops. Respiratory: Normal respiratory effort without tachypnea nor retractions. Breath sounds are clear and equal bilaterally. No wheezes/rales/rhonchi. Gastrointestinal: Soft and nontender. Normal bowel sounds Musculoskeletal: Nontender with normal range of  motion in extremities. No lower extremity tenderness nor edema. Neurologic:  Normal speech and language. No gross focal neurologic deficits are appreciated.  Skin:  Skin is warm, dry and intact. No rash noted. Psychiatric: Mood and affect are normal. Speech and behavior are normal.  ____________________________________________  ED COURSE:  As part of my medical decision making, I reviewed the following data within the [place] History obtained from family if available, nursing notes, old chart and ekg, as well as notes from prior ED visits. Patient presented for influenza-like illness, we will assess with labs as indicated at this time.   Procedures ____________________________________________   LABS (pertinent positives/negatives)  Labs Reviewed  URINALYSIS, COMPLETE (UACMP) WITH MICROSCOPIC - Abnormal; Notable for the following components:      Result Value   Color, Urine YELLOW (*)    APPearance CLEAR (*)    Leukocytes, UA LARGE (*)    WBC, UA >50 (*)    All other components within normal limits  CBC WITH DIFFERENTIAL/PLATELET - Abnormal; Notable for the following components:   Platelets 143 (*)    All other components within normal limits  COMPREHENSIVE METABOLIC PANEL - Abnormal; Notable for the following components:   Glucose, Bld 130 (*)    Calcium 8.8 (*)    All other components within normal limits  URINE CULTURE  LACTIC ACID, PLASMA  ____________________________________________  DIFFERENTIAL DIAGNOSIS   Influenza-like illness, influenza, pneumonia, sepsis  FINAL ASSESSMENT AND PLAN  Influenza-like illness   Plan: The patient had presented for persistent symptoms and fever. Patient's labs are unremarkable with the exception of white cells in his urine which I do not think is actually from infection, but nonetheless we gave him IV Rocephin. Patient's imaging yesterday was normal.  I will encourage him to take Tamiflu and follow-up with his  doctor as an outpatient.   Laurence Aly, MD   Note: This note was generated in part or whole with voice recognition software. Voice recognition is usually quite accurate but there are transcription errors that can and very often do occur. I apologize for any typographical errors that were not detected and corrected.     Earleen Newport, MD 06/30/18 1911

## 2018-06-30 NOTE — ED Triage Notes (Addendum)
Patient presents to the ED complaining of a fever of 103 this am at 9am.  Patient states he took 1 "excedrin" at that time.  Patient states he was seen in the ED yesterday and diagnosed with the flu and prescribed tamiflu.  Patient states he came back to the ED because his temp was 103.  Patient states he has not started the tamiflu.  Patient states, "I just feel so miserable, I didn't know what to do."  Patient denies shortness of breath or chest pain.

## 2018-06-30 NOTE — ED Notes (Signed)
Patient verbalized understanding of discharge instructions, no questions. Patient ambulated out ED with steady gait in no distress.

## 2018-06-30 NOTE — ED Notes (Signed)
Pt refuses CXR stating that he had one last night

## 2018-07-02 LAB — URINE CULTURE: Culture: <10000 — AB

## 2018-07-04 LAB — CULTURE, BLOOD (ROUTINE X 2)
CULTURE: NO GROWTH
Culture: NO GROWTH
Special Requests: ADEQUATE

## 2018-10-20 ENCOUNTER — Other Ambulatory Visit: Payer: Self-pay

## 2018-10-20 ENCOUNTER — Emergency Department: Payer: BLUE CROSS/BLUE SHIELD

## 2018-10-20 ENCOUNTER — Emergency Department
Admission: EM | Admit: 2018-10-20 | Discharge: 2018-10-20 | Disposition: A | Discharge status: Home / Self Care | Payer: BLUE CROSS/BLUE SHIELD | Attending: Emergency Medicine | Admitting: Emergency Medicine

## 2018-10-20 DIAGNOSIS — J45909 Unspecified asthma, uncomplicated: Secondary | ICD-10-CM | POA: Insufficient documentation

## 2018-10-20 DIAGNOSIS — Z79899 Other long term (current) drug therapy: Secondary | ICD-10-CM | POA: Insufficient documentation

## 2018-10-20 DIAGNOSIS — R519 Headache, unspecified: Secondary | ICD-10-CM

## 2018-10-20 DIAGNOSIS — Z85040 Personal history of malignant carcinoid tumor of rectum: Secondary | ICD-10-CM | POA: Diagnosis not present

## 2018-10-20 DIAGNOSIS — R42 Dizziness and giddiness: Secondary | ICD-10-CM | POA: Diagnosis not present

## 2018-10-20 DIAGNOSIS — I10 Essential (primary) hypertension: Secondary | ICD-10-CM | POA: Insufficient documentation

## 2018-10-20 DIAGNOSIS — Z8546 Personal history of malignant neoplasm of prostate: Secondary | ICD-10-CM | POA: Diagnosis not present

## 2018-10-20 DIAGNOSIS — R51 Headache: Secondary | ICD-10-CM | POA: Insufficient documentation

## 2018-10-20 LAB — BASIC METABOLIC PANEL
Anion gap: 7 (ref 5–15)
BUN: 17 mg/dL (ref 6–20)
CO2: 28 mmol/L (ref 22–32)
Calcium: 9.5 mg/dL (ref 8.9–10.3)
Chloride: 102 mmol/L (ref 98–111)
Creatinine, Ser: 0.88 mg/dL (ref 0.61–1.24)
GFR calc Af Amer: >60 mL/min (ref >60)
GFR calc non Af Amer: >60 mL/min (ref >60)
Glucose, Bld: 130 mg/dL — ABNORMAL HIGH (ref 70–99)
Potassium: 4 mmol/L (ref 3.5–5.1)
Sodium: 137 mmol/L (ref 135–145)

## 2018-10-20 LAB — CBC
HCT: 45.1 % (ref 39.0–52.0)
Hemoglobin: 15.1 g/dL (ref 13.0–17.0)
MCH: 28 pg (ref 26.0–34.0)
MCHC: 33.5 g/dL (ref 30.0–36.0)
MCV: 83.7 fL (ref 80.0–100.0)
NRBC: 0 % (ref 0.0–0.2)
Platelets: 245 10*3/uL (ref 150–400)
RBC: 5.39 MIL/uL (ref 4.22–5.81)
RDW: 13.1 % (ref 11.5–15.5)
WBC: 7.6 10*3/uL (ref 4.0–10.5)

## 2018-10-20 LAB — TROPONIN I: Troponin I: <0.03 ng/mL (ref <0.03)

## 2018-10-20 MED ORDER — BUTALBITAL-APAP-CAFFEINE 50-325-40 MG PO TABS: 1.0000 | ORAL_TABLET | Freq: Three times a day (TID) | ORAL | 0 refills | Status: AC | PRN

## 2018-10-20 MED ORDER — BUTALBITAL-APAP-CAFFEINE 50-325-40 MG PO TABS
2.0000 | ORAL_TABLET | ORAL | Status: AC
  Administered 2018-10-20: 2 via ORAL
  Filled 2018-10-20: qty 2

## 2018-10-20 MED ORDER — SODIUM CHLORIDE 0.9% FLUSH: 3.0000 mL | Freq: Once | INTRAVENOUS | Status: DC

## 2018-10-20 NOTE — ED Triage Notes (Signed)
Pt come via POV from home with c/o dizziness and headache. Pt states this started yesterday. Pt state nausea.  Pt also state left arm pain. Pt denies any chest pain.

## 2018-10-20 NOTE — Discharge Instructions (Signed)

## 2018-10-20 NOTE — ED Provider Notes (Signed)
[facility] Emergency Department Provider Note ____________________________________________   None    (approximate)  I have reviewed the triage vital signs and the nursing notes.  HISTORY  Chief Complaint Dizziness and Headache  HPI Patrick Burns is a 58 y.o. male here for evaluation of headache  Patient reports for a long time now he has had episodes where gets a little bit dizzy when he gets up, but for the last 24 hours he has had a fairly persistent throbbing type frontal headache.  Reports it started and had some weird vision any changes a little bit like a TV that was blurry at the beginning.  He does report he has a history of migraines, normally he takes Fioricet for this but he says the last time he had a headache was a while ago when he did not have any Fioricet to take.  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  Currently reports this was treated with chemo and radiation therapy and is believed to be improved  No numbness tingling or weakness except he is noticed a bit of a pain that shoots from his left elbow into his left hand and sometimes gets a little tingling in his fourth and fifth fingers on that hand.  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  No neck pain.  No weakness in the hand.  Right now reports just a moderately persisting headache reports he just knows he needs to get some type of medication in order to "break it"  No fevers or chills.  No neck pain.  No nausea vomiting.  Headache came on somewhat slowly over a couple hours last night but first was initiated with a weird TV like fuzziness.  Does also have a history of migraines in him and his mother but reports it has been a while since his last migraine thus did not have any Fioricet   Past Medical History:  Diagnosis Date  .  Asthma   . Blood dyscrasia    "BLOOD IS THICK"  . Dyspnea on exertion   . GERD (gastroesophageal reflux disease)   . Headache    MIGRAINES  . Hypogonadism male   . Leaky heart valve   . Metastatic cancer ([place]) 2006   Rectum --> prostate --> bladder/chemo and radiation/ [facility]/squamous cell carcinoma anal rectum area  . Peripheral edema   . Sleep apnea    NOT USING CPAP CURRENTLY  . Umbilical hernia     Patient Active Problem List   Diagnosis Date Noted  . Chronic venous insufficiency 01/02/2018  . Lymphedema 01/02/2018  . Essential hypertension 12/03/2017  . Bilateral lower extremity edema 12/03/2017  . Lower extremity pain, bilateral 12/03/2017  . Hyperlipidemia 12/03/2017  . Umbilical hernia without obstruction and without gangrene 04/22/2015  . Diastasis recti 04/22/2015    Past Surgical History:  Procedure Laterality Date  . COLONOSCOPY  2011   [facility]  . KNEE SURGERY    . TONSILLECTOMY    . UMBILICAL HERNIA REPAIR N/A 05/06/2015   Procedure: HERNIA REPAIR UMBILICAL ADULT;  Surgeon: Christene Lye, MD;  Location: [facility] ORS;  Service: General;  Laterality: N/A;    Prior to Admission medications   Medication Sig Start Date End Date Taking? Authorizing Provider  albuterol (VENTOLIN HFA) 108 (90 BASE) MCG/ACT inhaler Inhale 2  puffs into the lungs every 4 (four) hours as needed.  12/21/11   [provider]  amLODipine-benazepril (LOTREL) 5-20 MG capsule Take by mouth.    [provider]  Aspirin-Acetaminophen-Caffeine (EXCEDRIN MIGRAINE PO) Take by mouth as needed.    [provider]  beclomethasone (QVAR) 40 MCG/ACT inhaler Inhale into the lungs. 06/15/14   [provider]  butalbital-acetaminophen-caffeine (FIORICET, ESGIC) 50-325-40 MG tablet Take 1-2 tablets by mouth every 8 (eight) hours as needed for headache. 10/20/18 10/20/19  Delman Kitten, MD  cetirizine (ZYRTEC) 10 MG tablet Take 10 mg by mouth.    [provider]    chlorpheniramine-HYDROcodone (TUSSIONEX PENNKINETIC ER) 10-8 MG/5ML SUER Take 5 mLs by mouth 2 (two) times daily. 06/29/18   Earleen Newport, MD  Coenzyme Q10 (CO Q-10) 200 MG CAPS Take 1 capsule by mouth daily.    [provider]  DOCOSAHEXAENOIC ACID PO Take by mouth.    [provider]  EPINEPHrine (EPIPEN IJ) Inject 1 Dose as directed as needed.    [provider]  fenofibrate (TRICOR) 145 MG tablet Take 145 mg by mouth every morning.  12/21/11   [provider]  fluticasone (FLONASE) 50 MCG/ACT nasal spray 1 SPRAY PER NARES PRN 03/04/15   [provider]  glucosamine-chondroitin 500-400 MG tablet Take 1 tablet by mouth 2 (two) times daily.    [provider]  Multiple Vitamin (MULTI-VITAMINS) TABS Take by mouth.    [provider]  mupirocin cream (BACTROBAN) 2 % Apply to affected area 3 times daily 12/10/16   Cuthriell, Roderic Palau D, PA-C  Nutritional Supplements (QUINOA KALE & HEMP) LIQD Take 1 drop by mouth daily.    [provider]  ondansetron (ZOFRAN) 4 MG tablet Take 1 tablet (4 mg total) by mouth every 8 (eight) hours as needed for nausea or vomiting. 10/15/15   Daymon Larsen, MD  pantoprazole (PROTONIX) 40 MG tablet Take 40 mg by mouth every morning.  12/21/11   [provider]  Plant Sterols and Stanols (CHOLEST OFF PO) Take 2 tablets by mouth daily.    [provider]  podofilox (CONDYLOX) 0.5 % gel Apply topically. 05/23/16   [provider]  Meade Maw 40 MCG/ACT inhaler  11/26/17   [provider]  ranitidine (ZANTAC) 150 MG tablet Take by mouth.    [provider]  sildenafil (REVATIO) 20 MG tablet Take 1 tablet (20 mg total) by mouth daily as needed. 06/01/15   [provider]  sildenafil (VIAGRA) 100 MG tablet Take 100 mg by mouth as needed.  08/07/14   [provider]  sulfamethoxazole-trimethoprim (BACTRIM DS,SEPTRA DS) 800-160 MG tablet  Take 1 tablet by mouth 2 (two) times daily. 12/10/16   Cuthriell, Charline Bills, PA-C  testosterone cypionate (DEPOTESTOSTERONE CYPIONATE) 200 MG/ML injection INJECT 1 ML (200 MG TOTAL) INTO THE MUSCLE EVERY FOURTEEN (14) DAYS. 10/05/17   [provider]  tiZANidine (ZANAFLEX) 2 MG tablet TAKE 1 TABLET ORALLY TWICE DAILY AS NEEDED FOR MUSCLE SPASMS 11/02/17   [provider]  vitamin E (VITAMIN E) 400 UNIT capsule Take 400 Units by mouth daily.    [provider]  vitamin E 400 UNIT capsule Take by mouth.    [provider]    Allergies Bee venom; Ibuprofen; Biaxin [clarithromycin]; and Mango flavor  Family History  Problem Relation Age of Onset  . Diabetes Father     Social History Social History   Tobacco Use  .  Smoking status: Never Smoker  . Smokeless tobacco: Current User    Types: Snuff  Substance Use Topics  . Alcohol use: Yes    Alcohol/week: 0.0 standard drinks    Comment: OCC SIP MOONSHINE  . Drug use: No    Review of Systems Constitutional: No fever/chills.  Eyes: No visual changes. ENT: No sore throat. Cardiovascular: Denies chest pain. Respiratory: Denies shortness of breath. Gastrointestinal: No abdominal pain.   Genitourinary: Negative for dysuria. Musculoskeletal: Negative for back pain. Skin: Negative for rash. Neurological: Negative for areas of focal weakness or numbness except some off and on his left arm into the fourth and fifth fingers of the left hand comes and goes, seems little worse after driving truck.    ____________________________________________   PHYSICAL EXAM:  VITAL SIGNS: ED Triage Vitals  Enc Vitals Group     BP 10/20/18 1228 121/77     Pulse Rate 10/20/18 1228 78     Resp 10/20/18 1228 18     Temp 10/20/18 1228 98.2 F (36.8 C)     Temp src --      SpO2 10/20/18 1228 94 %     Weight 10/20/18 1229 300 lb (136.1 kg)     Height 10/20/18 1229 6' (1.829 m)     Head Circumference --      Peak Flow  --      Pain Score 10/20/18 1229 6     Pain Loc --      Pain Edu? --      Excl. in [place]? --     Constitutional: Alert and oriented. Well appearing and in no acute distress.  He and his wife both very pleasant. Eyes: Conjunctivae are normal. Head: Atraumatic. Nose: No congestion/rhinnorhea. Mouth/Throat: Mucous membranes are moist. Neck: No stridor.  Cardiovascular: Normal rate, regular rhythm. Grossly normal heart sounds.  Good peripheral circulation. Respiratory: Normal respiratory effort.  No retractions. Lungs CTAB. Gastrointestinal: Soft and nontender. No distention. Musculoskeletal: No lower extremity tenderness and 2+ bilateral lower extremity pitting edema which she reports she follows with cardiology for this is unchanged.  Chronic.  RIGHT Right upper extremity demonstrates normal strength, good use of all muscles. No edema bruising or contusions of the right shoulder/upper arm, right elbow, right forearm / hand. Full range of motion of the right right upper extremity without pain. No evidence of trauma. Strong radial pulse. Intact median/ulnar/radial neuro-muscular exam.  LEFT Left upper extremity demonstrates normal strength, good use of all muscles. No edema bruising or contusions of the left shoulder/upper arm, left elbow, left forearm / hand. Full range of motion of the left  upper extremity without pain. No evidence of trauma. Strong radial pulse. Intact median/ulnar/radial neuro-muscular exam.  Presently not endorsing any neurologic symptoms involving the left arm and left hand.  No longer having any pain in this area.   Neurologic:  Normal speech and language. No gross focal neurologic deficits are appreciated.  Skin:  Skin is warm, dry and intact. No rash noted. Psychiatric: Mood and affect are normal. Speech and behavior are normal.  ____________________________________________   LABS (all labs ordered are listed, but only abnormal results are displayed)  Labs  Reviewed  BASIC METABOLIC PANEL - Abnormal; Notable for the following components:      Result Value   Glucose, Bld 130 (*)    All other components within normal limits  CBC  TROPONIN I  URINALYSIS, COMPLETE (UACMP) WITH MICROSCOPIC  CBG MONITORING, ED   ____________________________________________  EKG  ED ECG REPORT I, Delman Kitten, the attending physician, personally viewed and interpreted this ECG.  Date: 10/20/2018 EKG Time: 1235 Rate: 75 Rhythm: normal sinus rhythm QRS Axis: normal Intervals: normal ST/T Wave abnormalities: normal Narrative Interpretation: no evidence of acute ischemia  ____________________________________________  RADIOLOGY  Ct Head Wo Contrast  Result Date: 10/20/2018 CLINICAL DATA:  Dizziness and headache. History of metastatic cancer. EXAM: CT HEAD WITHOUT CONTRAST TECHNIQUE: Contiguous axial images were obtained from the base of the skull through the vertex without intravenous contrast. COMPARISON:  None. FINDINGS: Brain: No evidence of acute infarction, hemorrhage, hydrocephalus, extra-axial collection or mass lesion/mass effect. Vascular: Negative for hyperdense vessel Skull: Negative Sinuses/Orbits: Negative Other: None IMPRESSION: Negative CT head Electronically Signed   By: Franchot Gallo M.D.   On: 10/20/2018 14:22    CT head negative for acute ____________________________________________   PROCEDURES  Procedure(s) performed: None  Procedures  Critical Care performed: No  ____________________________________________   INITIAL IMPRESSION / ASSESSMENT AND PLAN / ED COURSE  Pertinent labs & imaging results that were available during my care of the patient were reviewed by me and considered in my medical decision making (see chart for details).     Heart Score 2 (no chest pain either). EKG normal. Trop normal.     Patient reports headache, history of migraine in the past usually takes Fioricet but did not have any.  Very reassuring  examination today.  No red flags.  Symptoms sound like he developed scotomata followed by the headache, classically migrainous in nature.  Reassuring neurologic exam.  Some occasional symptoms in the left hand but seem to be consistent with likely more of an ulnar nerve irritation or tendinitis type symptomatology and do not seem to represent a central cause.  No associated neck pain.  He is afebrile no meningismus, no infectious symptoms, lab work very reassuring.  Ordered CT of the head as he has a history of metastatic prostate cancer, but overall risk for metastases felt to be low does have a history of migrainous and chronic headaches similar in the past.  ----------------------------------------- 2:59 PM on 10/20/2018 -----------------------------------------   Patient reports all symptoms improved after Fioricet.  He feels much improved at this time, headache is gone.  Not driving himself this evening, discussed careful use of Fioricet and only as prescribed and recommended against driving motor vehicle within 8 hours of its use.  Patient agreement.  We will follow-up closely with Dr. Christen Bame (spelling) his PCP.  Return precautions and treatment recommendations and follow-up discussed with the patient who is agreeable with the plan.  ____________________________________________   FINAL CLINICAL IMPRESSION(S) / ED DIAGNOSES  Final diagnoses:  Nonintractable headache, unspecified chronicity pattern, unspecified headache type        Note:  This document was prepared using Dragon voice recognition software and may include unintentional dictation errors       Delman Kitten, MD 10/20/18 1507

## 2019-04-15 ENCOUNTER — Encounter: Payer: BC Managed Care – PPO | Attending: Internal Medicine | Admitting: *Deleted

## 2019-04-15 ENCOUNTER — Encounter: Payer: Self-pay | Admitting: *Deleted

## 2019-04-15 ENCOUNTER — Other Ambulatory Visit: Payer: Self-pay

## 2019-04-15 VITALS — BP 108/70 | Ht 72.0 in | Wt 312.9 lb

## 2019-04-15 DIAGNOSIS — Z79899 Other long term (current) drug therapy: Secondary | ICD-10-CM | POA: Diagnosis not present

## 2019-04-15 DIAGNOSIS — E119 Type 2 diabetes mellitus without complications: Secondary | ICD-10-CM | POA: Insufficient documentation

## 2019-04-15 DIAGNOSIS — J45909 Unspecified asthma, uncomplicated: Secondary | ICD-10-CM | POA: Diagnosis not present

## 2019-04-15 DIAGNOSIS — E291 Testicular hypofunction: Secondary | ICD-10-CM | POA: Insufficient documentation

## 2019-04-15 DIAGNOSIS — G4733 Obstructive sleep apnea (adult) (pediatric): Secondary | ICD-10-CM | POA: Diagnosis not present

## 2019-04-15 DIAGNOSIS — I10 Essential (primary) hypertension: Secondary | ICD-10-CM | POA: Insufficient documentation

## 2019-04-15 DIAGNOSIS — Z713 Dietary counseling and surveillance: Secondary | ICD-10-CM | POA: Insufficient documentation

## 2019-04-15 DIAGNOSIS — K219 Gastro-esophageal reflux disease without esophagitis: Secondary | ICD-10-CM | POA: Insufficient documentation

## 2019-04-15 DIAGNOSIS — E785 Hyperlipidemia, unspecified: Secondary | ICD-10-CM | POA: Diagnosis not present

## 2019-04-15 NOTE — Patient Instructions (Signed)
Check blood sugars 2 x day before breakfast and 2 hrs after supper every day Bring blood sugar records to the next class  Exercise: Begin walking for 10-15 minutes 3 days a week and gradually increase to 30 minutes 5 x week  Eat 3 meals day,  1-2  snacks a day Space meals 4-6 hours apart Don't skip meals Avoid sugar sweetened drinks (soda)  Decrease dipping   Make an eye doctor appointment  Return for classes on:

## 2019-04-16 NOTE — Progress Notes (Signed)
Diabetes Self-Management Education  Visit Type: First/Initial  Appt. Start Time: 1335 Appt. End Time: 6222  04/15/2019  Mr. Patrick Burns, identified by name and date of birth, is a 58 y.o. male with a diagnosis of Diabetes: Type 2.   ASSESSMENT  Blood pressure 108/70, height 6' (1.829 m), weight (!) 312 lb 14.4 oz (141.9 kg). Body mass index is 42.44 kg/m.  Diabetes Self-Management Education - 04/15/19 1517      Visit Information   Visit Type  First/Initial      Initial Visit   Diabetes Type  Type 2    Are you currently following a meal plan?  Yes    What type of meal plan do you follow?  "decreased sweets"    Are you taking your medications as prescribed?  No   hasn't started [place] yet   Date Diagnosed  1 week      Health Coping   How would you rate your overall health?  Fair      Psychosocial Assessment   Patient Belief/Attitude about Diabetes  Other (comment)   "depressing"   Self-care barriers  None    Self-management support  Doctor's office;Friends;Family    Patient Concerns  Nutrition/Meal planning;Medication;Weight Control;Glycemic Control;Monitoring;Other (comment)   "quit dipping"   Special Needs  None    Preferred Learning Style  Auditory;Hands on    Learning Readiness  Change in progress    How often do you need to have someone help you when you read instructions, pamphlets, or other written materials from your doctor or pharmacy?  1 - Never    What is the last grade level you completed in school?  12th grade      Pre-Education Assessment   Patient understands the diabetes disease and treatment process.  Needs Instruction    Patient understands incorporating nutritional management into lifestyle.  Needs Instruction    Patient undertands incorporating physical activity into lifestyle.  Needs Instruction    Patient understands using medications safely.  Needs Instruction    Patient understands monitoring blood glucose, interpreting and using results   Needs Review    Patient understands prevention, detection, and treatment of acute complications.  Needs Instruction    Patient understands prevention, detection, and treatment of chronic complications.  Needs Instruction    Patient understands how to develop strategies to address psychosocial issues.  Needs Instruction    Patient understands how to develop strategies to promote health/change behavior.  Needs Instruction      Complications   Last HgB A1C per patient/outside source  6.8 %   03/24/2019   How often do you check your blood sugar?  1-2 times/day    Fasting Blood glucose range (mg/dL)  70-129;130-179   FBG's 108-132 mg/dL   Postprandial Blood glucose range (mg/dL)  70-129;130-179   pp's 122-148 mg/dL   Have you had a dilated eye exam in the past 12 months?  No    Have you had a dental exam in the past 12 months?  No   only has 6 teeth on the bottom   Are you checking your feet?  No      Dietary Intake   Breakfast  skips or eats egg whites, wheat bread, jelly, almond milk    Lunch  tuna sandwich, hot dogs, chicken legs    Dinner  pizza, [place] with rice noodles, shrimp; beef, steak occasional pork with baked potatoes, green beans, asparagus, brussel sprouts, spinach, broccoli, cauliflower    Beverage(s)  water, milk,  Dr Malachi Bonds 2 x week      Exercise   Exercise Type  ADL's      Patient Education   Previous Diabetes Education  No    Disease state   Definition of diabetes, type 1 and 2, and the diagnosis of diabetes;Factors that contribute to the development of diabetes    Nutrition management   Role of diet in the treatment of diabetes and the relationship between the three main macronutrients and blood glucose level;Food label reading, portion sizes and measuring food.;Reviewed blood glucose goals for pre and post meals and how to evaluate the patients' food intake on their blood glucose level.    Physical activity and exercise   Role of exercise on diabetes management, blood  pressure control and cardiac health.    Medications  Reviewed patients medication for diabetes, action, purpose, timing of dose and side effects.    Monitoring  Purpose and frequency of SMBG.;Taught/discussed recording of test results and interpretation of SMBG.;Identified appropriate SMBG and/or A1C goals.    Chronic complications  Relationship between chronic complications and blood glucose control    Psychosocial adjustment  Identified and addressed patients feelings and concerns about diabetes    Personal strategies to promote health  Review risk of smoking and offered smoking cessation      Individualized Goals (developed by patient)   Reducing Risk Improve blood sugars Decrease medications Prevent diabetes complications Lose weight Quit dipping     Outcomes   Expected Outcomes  Demonstrated interest in learning. Expect positive outcomes       Individualized Plan for Diabetes Self-Management Training:   Learning Objective:  Patient will have a greater understanding of diabetes self-management. Patient education plan is to attend individual and/or group sessions per assessed needs and concerns.   Plan:   Patient Instructions  Check blood sugars 2 x day before breakfast and 2 hrs after supper every day Bring blood sugar records to the next class Exercise: Begin walking for 10-15 minutes 3 days a week and gradually increase to 30 minutes 5 x week Eat 3 meals day,  1-2  snacks a day Space meals 4-6 hours apart Don't skip meals Avoid sugar sweetened drinks (soda) Decrease dipping  Make an eye doctor appointment  Expected Outcomes:  Demonstrated interest in learning. Expect positive outcomes  Education material provided: General Meal Planning Guidelines Simple Meal Plan  If problems or questions, patient to contact team via:   Johny Drilling, [place], [place], CDE [phone]  Future DSME appointment:  April 24, 2019 for Diabetes Class 1

## 2019-04-24 ENCOUNTER — Encounter: Payer: BC Managed Care – PPO | Admitting: Dietician

## 2019-04-24 ENCOUNTER — Other Ambulatory Visit: Payer: Self-pay

## 2019-04-24 DIAGNOSIS — Z713 Dietary counseling and surveillance: Secondary | ICD-10-CM | POA: Diagnosis not present

## 2019-04-24 DIAGNOSIS — E119 Type 2 diabetes mellitus without complications: Secondary | ICD-10-CM

## 2019-04-24 NOTE — Progress Notes (Signed)

## 2019-05-22 ENCOUNTER — Ambulatory Visit: Payer: BC Managed Care – PPO

## 2019-05-22 ENCOUNTER — Telehealth: Payer: Self-pay | Admitting: *Deleted

## 2019-05-22 ENCOUNTER — Telehealth: Payer: Self-pay | Admitting: Dietician

## 2019-05-22 NOTE — Telephone Encounter (Signed)
Returned voicemail message from patient to reschedule diabetes class 2 for October. Left a message for him with next available date and time, 06/19/19 for Thursday am class, or Monday 06/23/19 for evening class. Requested a call back for patient to confirm date and time.

## 2019-05-22 NOTE — Telephone Encounter (Signed)
Patient didn't show for Diabetes Class 2 today. Left a message to call back and reschedule. He is also scheduled for Class 3 on Oct 29.

## 2019-06-12 ENCOUNTER — Telehealth: Payer: Self-pay | Admitting: Dietician

## 2019-06-12 NOTE — Telephone Encounter (Signed)
Returned voicemail message from patient to reschedule diabetes classes 2 and 3. Left message for him offering next available dates for class 2, and requested a call back.

## 2019-06-18 ENCOUNTER — Telehealth: Payer: Self-pay | Admitting: Dietician

## 2019-06-18 NOTE — Telephone Encounter (Signed)
Returned message from patient to reschedule diabetes class 2. Spoke to patient, rescheduled to Monday 06/23/19. Informed patient that he is on schedule to come for class 3 on Thursday 06/26/19; will confirm or reschedule when he comes to class 2.

## 2019-06-23 ENCOUNTER — Encounter: Payer: BC Managed Care – PPO | Attending: Internal Medicine | Admitting: Dietician

## 2019-06-23 ENCOUNTER — Encounter: Payer: Self-pay | Admitting: Dietician

## 2019-06-23 ENCOUNTER — Other Ambulatory Visit: Payer: Self-pay

## 2019-06-23 VITALS — Wt 302.7 lb

## 2019-06-23 DIAGNOSIS — E119 Type 2 diabetes mellitus without complications: Secondary | ICD-10-CM | POA: Diagnosis not present

## 2019-06-23 DIAGNOSIS — E785 Hyperlipidemia, unspecified: Secondary | ICD-10-CM | POA: Insufficient documentation

## 2019-06-23 DIAGNOSIS — G4733 Obstructive sleep apnea (adult) (pediatric): Secondary | ICD-10-CM | POA: Insufficient documentation

## 2019-06-23 DIAGNOSIS — Z79899 Other long term (current) drug therapy: Secondary | ICD-10-CM | POA: Insufficient documentation

## 2019-06-23 DIAGNOSIS — J45909 Unspecified asthma, uncomplicated: Secondary | ICD-10-CM | POA: Insufficient documentation

## 2019-06-23 DIAGNOSIS — E291 Testicular hypofunction: Secondary | ICD-10-CM | POA: Diagnosis not present

## 2019-06-23 DIAGNOSIS — K219 Gastro-esophageal reflux disease without esophagitis: Secondary | ICD-10-CM | POA: Diagnosis not present

## 2019-06-23 DIAGNOSIS — I10 Essential (primary) hypertension: Secondary | ICD-10-CM | POA: Insufficient documentation

## 2019-06-23 DIAGNOSIS — Z713 Dietary counseling and surveillance: Secondary | ICD-10-CM | POA: Diagnosis not present

## 2019-06-23 NOTE — Progress Notes (Signed)

## 2019-06-26 ENCOUNTER — Encounter: Payer: Self-pay | Admitting: Dietician

## 2019-06-26 ENCOUNTER — Encounter: Payer: BC Managed Care – PPO | Admitting: Dietician

## 2019-06-26 ENCOUNTER — Other Ambulatory Visit: Payer: Self-pay

## 2019-06-26 VITALS — BP 110/78 | Ht 72.0 in | Wt 300.2 lb

## 2019-06-26 DIAGNOSIS — E119 Type 2 diabetes mellitus without complications: Secondary | ICD-10-CM

## 2019-06-26 DIAGNOSIS — Z713 Dietary counseling and surveillance: Secondary | ICD-10-CM | POA: Diagnosis not present

## 2019-06-26 NOTE — Progress Notes (Signed)

## 2019-07-02 ENCOUNTER — Encounter: Payer: Self-pay | Admitting: *Deleted

## 2020-06-22 ENCOUNTER — Ambulatory Visit: Payer: BC Managed Care – PPO | Admitting: Surgery

## 2020-06-29 ENCOUNTER — Ambulatory Visit (INDEPENDENT_AMBULATORY_CARE_PROVIDER_SITE_OTHER): Payer: BC Managed Care – PPO | Admitting: Surgery

## 2020-06-29 ENCOUNTER — Encounter: Payer: Self-pay | Admitting: Surgery

## 2020-06-29 ENCOUNTER — Other Ambulatory Visit: Payer: Self-pay

## 2020-06-29 VITALS — BP 119/80 | HR 88 | Temp 98.4°F | Resp 12 | Ht 72.0 in | Wt 294.0 lb

## 2020-06-29 DIAGNOSIS — M6208 Separation of muscle (nontraumatic), other site: Secondary | ICD-10-CM | POA: Diagnosis not present

## 2020-06-29 DIAGNOSIS — Z9889 Other specified postprocedural states: Secondary | ICD-10-CM

## 2020-06-29 DIAGNOSIS — Z8719 Personal history of other diseases of the digestive system: Secondary | ICD-10-CM | POA: Diagnosis not present

## 2020-06-29 NOTE — Patient Instructions (Signed)
Follow up as needed, call the office if you have any questions or concerns.  

## 2020-06-29 NOTE — Progress Notes (Signed)
Patient ID: Patrick Burns, male   DOB: May 29, 1961, 59 y.o.   MRN: [id number]  Chief Complaint: Concerns regarding recurrent umbilical hernia  History of Present Illness Patrick Burns is a 59 y.o. male with prior hernia repair just over 5 years ago with a 6.4 cm circular mesh, for an umbilical hernia.  Has of late noted the presence of a tender area when he rubs it against something.  This has been going on for a year.  He is currently working on weight loss.  He denies fevers, chills, nausea, vomiting.  Past Medical History Past Medical History:  Diagnosis Date   Asthma    Blood dyscrasia    "BLOOD IS THICK"   Diabetes mellitus without complication (HCC)    Dyspnea on exertion    GERD (gastroesophageal reflux disease)    Headache    MIGRAINES   Hypertension    Hypogonadism male    Leaky heart valve    Metastatic cancer ([place]) 2006   Rectum --> prostate --> bladder/chemo and radiation/ [facility]/squamous cell carcinoma anal rectum area   Peripheral edema    Sleep apnea    NOT USING CPAP CURRENTLY   Umbilical hernia       Past Surgical History:  Procedure Laterality Date   COLONOSCOPY  2011   [facility]   KNEE SURGERY     TONSILLECTOMY     UMBILICAL HERNIA REPAIR N/A 05/06/2015   Procedure: HERNIA REPAIR UMBILICAL ADULT;  Surgeon: Christene Lye, MD;  Location: [facility] ORS;  Service: General;  Laterality: N/A;    Allergies  Allergen Reactions   Bee Venom Anaphylaxis   Ibuprofen Anaphylaxis   Biaxin [Clarithromycin] Nausea And Vomiting   Mango Flavor Swelling    LIPS   Metformin And Related Diarrhea    Current Outpatient Medications  Medication Sig Dispense Refill   albuterol (VENTOLIN HFA) 108 (90 BASE) MCG/ACT inhaler Inhale 2 puffs into the lungs every 4 (four) hours as needed.      amLODipine-benazepril (LOTREL) 5-20 MG capsule Take 1 capsule by mouth daily.      Aspirin-Acetaminophen-Caffeine (EXCEDRIN MIGRAINE PO) Take 2 tablets by mouth as needed.       cetirizine (ZYRTEC) 10 MG tablet Take 10 mg by mouth.     Coenzyme Q10 (CO Q-10) 200 MG CAPS Take 1 capsule by mouth daily.     EPINEPHrine (EPIPEN IJ) Inject 1 Dose as directed as needed.     famotidine (PEPCID) 20 MG tablet Take 20 mg by mouth 2 (two) times daily as needed.      FARXIGA 5 MG TABS tablet Take 5 mg by mouth daily.     fluticasone (FLONASE) 50 MCG/ACT nasal spray 1 SPRAY PER NARES PRN  5   glucosamine-chondroitin 500-400 MG tablet Take 1 tablet by mouth 2 (two) times daily.     KRILL OIL PO Take 1 capsule by mouth daily.     Multiple Vitamin (MULTI-VITAMINS) TABS Take 1 tablet by mouth daily.      Nutritional Supplements (QUINOA KALE & HEMP) LIQD Take 1 drop by mouth daily.     Plant Sterols and Stanols (CHOLEST OFF PO) Take 2 tablets by mouth daily.     QVAR REDIHALER 80 MCG/ACT inhaler Inhale 1 puff into the lungs daily.     rosuvastatin (CRESTOR) 10 MG tablet Take 10 mg by mouth at bedtime.     RYBELSUS 7 MG TABS Take 1 tablet by mouth at bedtime.  sildenafil (REVATIO) 20 MG tablet Take 20 mg by mouth daily as needed.     sildenafil (VIAGRA) 100 MG tablet Take 100 mg by mouth as needed.      Testosterone 1.62 % GEL Apply 1 application topically daily.      vitamin E (VITAMIN E) 400 UNIT capsule Take 400 Units by mouth daily.     No current facility-administered medications for this visit.    Family History Family History  Problem Relation Age of Onset   Diabetes Father    Diabetes Brother       Social History Social History   Tobacco Use   Smoking status: Never Smoker   Smokeless tobacco: Current User    Types: Snuff   Tobacco comment: smokelss x 44 years (1 can every other day)  Substance Use Topics   Alcohol use: Not Currently    Alcohol/week: 0.0 standard drinks    Comment: OCC SIP MOONSHINE   Drug use: No        Review of Systems  Constitutional: Negative.   HENT: Negative.   Eyes: Negative.   Respiratory: Negative.   Cardiovascular:  Positive for leg swelling.  Gastrointestinal: Negative.   Genitourinary: Negative.   Skin: Negative.   Neurological: Negative.   Psychiatric/Behavioral: Negative.       Physical Exam Blood pressure 119/80, pulse 88, temperature 98.4 F (36.9 C), resp. rate 12, height 6' (1.829 m), weight 294 lb (133.4 kg), SpO2 96 %. Last Weight  Most recent update: 06/29/2020  2:02 PM    Weight  133.4 kg (294 lb)             CONSTITUTIONAL: Well developed, and obese male, well nourished, appropriately responsive and aware without distress.     EYES: Sclera non-icteric.   EARS, NOSE, MOUTH AND THROAT: Mask worn.   Hearing is intact to voice.  NECK: Trachea is midline, and there is no jugular venous distension.  LYMPH NODES:  Lymph nodes in the neck are not enlarged. RESPIRATORY:  Lungs are clear, and breath sounds are equal bilaterally. Normal respiratory effort without pathologic use of accessory muscles. CARDIOVASCULAR: Heart is regular in rate and rhythm. GI: The abdomen is soft, nontender, and nondistended. There were no palpable masses.  There is no evidence of any fascial defect.  I believe I can palpate some of the mesh strap in the subcutaneous tissues near his previous repair.  This may be a source of his tenderness.  I do not note any change with Valsalva or rising from lying or sitting positioning.  No clinical evidence of recurrent hernia.  I did offer additional imaging.  I believe he felt reassured, he did question what his diastases recti was and I explained that to him and he understood this is not a surgically repairable issue at this time.  I did not appreciate hepatosplenomegaly. There were normal bowel sounds. MUSCULOSKELETAL:  Symmetrical muscle tone appreciated in all four extremities.    SKIN: Skin turgor is normal. No pathologic skin lesions appreciated.  NEUROLOGIC:  Motor and sensation appear grossly normal.  Cranial nerves are grossly without defect. PSYCH:  Alert and  oriented to person, place and time. Affect is appropriate for situation.  Data Reviewed I have personally reviewed what is currently available of the patient's imaging, recent labs and medical records.   Labs:  CBC Latest Ref Rng & Units 10/20/2018 06/30/2018 06/29/2018  WBC 4.0 - 10.5 K/uL 7.6 4.7 5.4  Hemoglobin 13.0 - 17.0 g/dL 15.1 14.6  14.5  Hematocrit 39 - 52 % 45.1 44.2 43.2  Platelets 150 - 400 K/uL 245 143(L) 168   CMP Latest Ref Rng & Units 10/20/2018 06/30/2018 06/29/2018  Glucose 70 - 99 mg/dL 130(H) 130(H) 169(H)  BUN 6 - 20 mg/dL 17 10 10   Creatinine 0.61 - 1.24 mg/dL 0.88 1.14 1.13  Sodium 135 - 145 mmol/L 137 138 137  Potassium 3.5 - 5.1 mmol/L 4.0 3.6 3.4(L)  Chloride 98 - 111 mmol/L 102 101 102  CO2 22 - 32 mmol/L 28 28 26   Calcium 8.9 - 10.3 mg/dL 9.5 8.8(L) 8.8(L)  Total Protein 6.5 - 8.1 g/dL - 7.4 -  Total Bilirubin 0.3 - 1.2 mg/dL - 1.2 -  Alkaline Phos 38 - 126 U/L - 66 -  AST 15 - 41 U/L - 24 -  ALT 0 - 44 U/L - 28 -      Imaging:  Within last 24 hrs: No results found.  Assessment    Prior history of umbilical hernia repair, no evidence of recurrence. Patient Active Problem List   Diagnosis Date Noted   Chronic venous insufficiency 01/02/2018   Lymphedema 01/02/2018   Essential hypertension 12/03/2017   Bilateral lower extremity edema 12/03/2017   Lower extremity pain, bilateral 12/03/2017   Hyperlipidemia 56/43/3295   Umbilical hernia without obstruction and without gangrene 04/22/2015   Diastasis recti 04/22/2015    Plan    Reassurance is given, I advised that since his previous surgeon has retired I will be glad to assist him should further concerns arise over the the near future.  We did discuss that weight loss would be most helpful at improving his overall condition and the stresses on his prior repair.  Face-to-face time spent with the patient and accompanying care providers(if present) was 30 minutes, with more than 50% of the time spent  counseling, educating, and coordinating care of the patient.      Ronny Bacon M.D., FACS 06/29/2020, 3:04 PM

## 2020-06-30 DIAGNOSIS — Z8719 Personal history of other diseases of the digestive system: Secondary | ICD-10-CM | POA: Insufficient documentation

## 2020-06-30 DIAGNOSIS — Z9889 Other specified postprocedural states: Secondary | ICD-10-CM | POA: Insufficient documentation

## 2020-08-10 ENCOUNTER — Emergency Department: Payer: BC Managed Care – PPO

## 2020-08-10 ENCOUNTER — Other Ambulatory Visit: Payer: Self-pay

## 2020-08-10 ENCOUNTER — Encounter: Payer: Self-pay | Admitting: Emergency Medicine

## 2020-08-10 ENCOUNTER — Emergency Department
Admission: EM | Admit: 2020-08-10 | Discharge: 2020-08-10 | Disposition: A | Discharge status: Home / Self Care | Payer: BC Managed Care – PPO | Attending: Emergency Medicine | Admitting: Emergency Medicine

## 2020-08-10 DIAGNOSIS — R142 Eructation: Secondary | ICD-10-CM

## 2020-08-10 DIAGNOSIS — K5901 Slow transit constipation: Secondary | ICD-10-CM | POA: Diagnosis not present

## 2020-08-10 DIAGNOSIS — R112 Nausea with vomiting, unspecified: Secondary | ICD-10-CM | POA: Diagnosis present

## 2020-08-10 DIAGNOSIS — Z79899 Other long term (current) drug therapy: Secondary | ICD-10-CM | POA: Diagnosis not present

## 2020-08-10 DIAGNOSIS — I10 Essential (primary) hypertension: Secondary | ICD-10-CM | POA: Diagnosis not present

## 2020-08-10 DIAGNOSIS — Z8546 Personal history of malignant neoplasm of prostate: Secondary | ICD-10-CM | POA: Diagnosis not present

## 2020-08-10 DIAGNOSIS — K037 Posteruptive color changes of dental hard tissues: Secondary | ICD-10-CM | POA: Diagnosis not present

## 2020-08-10 DIAGNOSIS — Z7982 Long term (current) use of aspirin: Secondary | ICD-10-CM | POA: Diagnosis not present

## 2020-08-10 DIAGNOSIS — J45909 Unspecified asthma, uncomplicated: Secondary | ICD-10-CM | POA: Diagnosis not present

## 2020-08-10 DIAGNOSIS — E119 Type 2 diabetes mellitus without complications: Secondary | ICD-10-CM | POA: Insufficient documentation

## 2020-08-10 LAB — URINALYSIS, COMPLETE (UACMP) WITH MICROSCOPIC
Bacteria, UA: NONE SEEN
Bilirubin Urine: NEGATIVE
Glucose, UA: >=500 mg/dL — AB
Hgb urine dipstick: NEGATIVE
Ketones, ur: NEGATIVE mg/dL
Leukocytes,Ua: NEGATIVE
Nitrite: NEGATIVE
Protein, ur: NEGATIVE mg/dL
Specific Gravity, Urine: 1.006 (ref 1.005–1.030)
pH: 6 (ref 5.0–8.0)

## 2020-08-10 LAB — CBC
HCT: 46.9 % (ref 39.0–52.0)
Hemoglobin: 15.5 g/dL (ref 13.0–17.0)
MCH: 28.4 pg (ref 26.0–34.0)
MCHC: 33 g/dL (ref 30.0–36.0)
MCV: 86.1 fL (ref 80.0–100.0)
Platelets: 189 10*3/uL (ref 150–400)
RBC: 5.45 MIL/uL (ref 4.22–5.81)
RDW: 13.1 % (ref 11.5–15.5)
WBC: 8.3 10*3/uL (ref 4.0–10.5)
nRBC: 0 % (ref 0.0–0.2)

## 2020-08-10 LAB — COMPREHENSIVE METABOLIC PANEL
ALT: 26 U/L (ref 0–44)
AST: 20 U/L (ref 15–41)
Albumin: 3.9 g/dL (ref 3.5–5.0)
Alkaline Phosphatase: 57 U/L (ref 38–126)
Anion gap: 9 (ref 5–15)
BUN: 15 mg/dL (ref 6–20)
CO2: 26 mmol/L (ref 22–32)
Calcium: 8.7 mg/dL — ABNORMAL LOW (ref 8.9–10.3)
Chloride: 103 mmol/L (ref 98–111)
Creatinine, Ser: 0.95 mg/dL (ref 0.61–1.24)
GFR, Estimated: >60 mL/min (ref >60)
Glucose, Bld: 110 mg/dL — ABNORMAL HIGH (ref 70–99)
Potassium: 3.7 mmol/L (ref 3.5–5.1)
Sodium: 138 mmol/L (ref 135–145)
Total Bilirubin: 0.7 mg/dL (ref 0.3–1.2)
Total Protein: 7.1 g/dL (ref 6.5–8.1)

## 2020-08-10 LAB — LIPASE, BLOOD: Lipase: 38 U/L (ref 11–51)

## 2020-08-10 LAB — TROPONIN I (HIGH SENSITIVITY): Troponin I (High Sensitivity): 3 ng/L (ref <18)

## 2020-08-10 MED ORDER — LIDOCAINE VISCOUS HCL 2 % MT SOLN
15.0000 mL | Freq: Once | OROMUCOSAL | Status: AC
  Administered 2020-08-10: 15 mL via ORAL
  Filled 2020-08-10: qty 15

## 2020-08-10 MED ORDER — IOHEXOL 300 MG/ML  SOLN
125.0000 mL | Freq: Once | INTRAMUSCULAR | Status: AC | PRN
  Administered 2020-08-10: 09:00:00 125 mL via INTRAVENOUS

## 2020-08-10 MED ORDER — ONDANSETRON 4 MG PO TBDP: 4.0000 mg | ORAL_TABLET | Freq: Three times a day (TID) | ORAL | 0 refills | Status: DC | PRN

## 2020-08-10 MED ORDER — POLYETHYLENE GLYCOL 3350 17 G PO PACK: PACK | ORAL | 0 refills | Status: DC

## 2020-08-10 MED ORDER — ALUM & MAG HYDROXIDE-SIMETH 200-200-20 MG/5ML PO SUSP
30.0000 mL | Freq: Once | ORAL | Status: AC
  Administered 2020-08-10: 30 mL via ORAL
  Filled 2020-08-10: qty 30

## 2020-08-10 MED ORDER — ONDANSETRON HCL 4 MG/2ML IJ SOLN
4.0000 mg | Freq: Once | INTRAMUSCULAR | Status: AC
  Administered 2020-08-10: 4 mg via INTRAVENOUS
  Filled 2020-08-10: qty 2

## 2020-08-10 MED ORDER — DOCUSATE SODIUM 100 MG PO CAPS: 100.0000 mg | ORAL_CAPSULE | Freq: Two times a day (BID) | ORAL | 0 refills | Status: AC

## 2020-08-10 MED ORDER — LACTATED RINGERS IV BOLUS
1000.0000 mL | Freq: Once | INTRAVENOUS | Status: AC
  Administered 2020-08-10: 1000 mL via INTRAVENOUS

## 2020-08-10 NOTE — ED Provider Notes (Signed)
[facility] Emergency Department Provider Note  ____________________________________________   Event Date/Time   First MD Initiated Contact with Patient 08/10/20 [phone]     (approximate)  I have reviewed the triage vital signs and the nursing notes.   HISTORY  Chief Complaint Emesis    HPI Patrick Burns is a 59 y.o. male  With h/o metastatic rectal CA, here with abdominal pain, vomiting. Pt reports sx started fairly abruptly 3 days ago with acute onset of nausea, vomiting, and diarrhea. Since then, pt has had ongoing, worsening nausea along with abdominal distension, intermittent diffuse cramp like pain. He's had poor appetite, belching, and sour taste in his mouth. He does note that this began at his father's house, and his father had a "stomach bug" prior to his visit, though it began on the day of visit. He also ate "a lot of food" that day. No fevers. No bilious or bloody emesis. No h/o bowel obstructions. No specific alleviating or aggravating factors.        Past Medical History:  Diagnosis Date  . Asthma   . Blood dyscrasia    "BLOOD IS THICK"  . Diabetes mellitus without complication ([place])   . Dyspnea on exertion   . GERD (gastroesophageal reflux disease)   . Headache    MIGRAINES  . Hypertension   . Hypogonadism male   . Leaky heart valve   . Metastatic cancer ([place]) 2006   Rectum --> prostate --> bladder/chemo and radiation/ [facility]/squamous cell carcinoma anal rectum area  . Peripheral edema   . Sleep apnea    NOT USING CPAP CURRENTLY  . Umbilical hernia     Patient Active Problem List   Diagnosis Date Noted  . Status post repair of ventral hernia 06/30/2020  . Chronic venous insufficiency 01/02/2018  . Lymphedema 01/02/2018  . Essential hypertension 12/03/2017  . Bilateral lower extremity edema 12/03/2017  . Lower extremity pain, bilateral 12/03/2017  . Hyperlipidemia 12/03/2017  . Diastasis recti 04/22/2015    Past  Surgical History:  Procedure Laterality Date  . COLONOSCOPY  2011   [facility]  . KNEE SURGERY    . TONSILLECTOMY    . UMBILICAL HERNIA REPAIR N/A 05/06/2015   Procedure: HERNIA REPAIR UMBILICAL ADULT;  Surgeon: Christene Lye, MD;  Location: [facility] ORS;  Service: General;  Laterality: N/A;    Prior to Admission medications   Medication Sig Start Date End Date Taking? Authorizing Provider  albuterol (VENTOLIN HFA) 108 (90 BASE) MCG/ACT inhaler Inhale 2 puffs into the lungs every 4 (four) hours as needed.  12/21/11   [provider]  amLODipine-benazepril (LOTREL) 5-20 MG capsule Take 1 capsule by mouth daily.     [provider]  Aspirin-Acetaminophen-Caffeine (EXCEDRIN MIGRAINE PO) Take 2 tablets by mouth as needed.     [provider]  cetirizine (ZYRTEC) 10 MG tablet Take 10 mg by mouth.    [provider]  Coenzyme Q10 (CO Q-10) 200 MG CAPS Take 1 capsule by mouth daily.    [provider]  docusate sodium (COLACE) 100 MG capsule Take 1 capsule (100 mg total) by mouth 2 (two) times daily for 7 days. 08/10/20 08/17/20  Duffy Bruce, MD  EPINEPHrine (EPIPEN IJ) Inject 1 Dose as directed as needed.    [provider]  famotidine (PEPCID) 20 MG tablet Take 20 mg by mouth 2 (two) times daily as needed.  04/04/19   [provider]  FARXIGA 5 MG TABS  tablet Take 5 mg by mouth daily. 04/09/19   [provider]  fluticasone (FLONASE) 50 MCG/ACT nasal spray 1 SPRAY PER NARES PRN 03/04/15   [provider]  glucosamine-chondroitin 500-400 MG tablet Take 1 tablet by mouth 2 (two) times daily.    [provider]  KRILL OIL PO Take 1 capsule by mouth daily.    [provider]  Multiple Vitamin (MULTI-VITAMINS) TABS Take 1 tablet by mouth daily.     [provider]  Nutritional Supplements (QUINOA KALE & HEMP) LIQD Take 1 drop by mouth daily.    [provider]  ondansetron (ZOFRAN ODT) 4  MG disintegrating tablet Take 1 tablet (4 mg total) by mouth every 8 (eight) hours as needed for nausea or vomiting. 08/10/20   Duffy Bruce, MD  Plant Sterols and Stanols (CHOLEST OFF PO) Take 2 tablets by mouth daily.    [provider]  polyethylene glycol (MIRALAX) 17 g packet Take twice a day until BM are soft, then once a day as needed for constipation. 08/10/20   Duffy Bruce, MD  QVAR REDIHALER 80 MCG/ACT inhaler Inhale 1 puff into the lungs daily. 04/04/19   [provider]  rosuvastatin (CRESTOR) 10 MG tablet Take 10 mg by mouth at bedtime. 06/28/20   [provider]  RYBELSUS 7 MG TABS Take 1 tablet by mouth at bedtime. 05/31/20   [provider]  sildenafil (REVATIO) 20 MG tablet Take 20 mg by mouth daily as needed. 03/02/20   [provider]  sildenafil (VIAGRA) 100 MG tablet Take 100 mg by mouth as needed.  08/07/14   [provider]  Testosterone 1.62 % GEL Apply 1 application topically daily.  02/04/19   [provider]  vitamin E (VITAMIN E) 400 UNIT capsule Take 400 Units by mouth daily.    [provider]    Allergies Bee venom, Ibuprofen, Biaxin [clarithromycin], Mango flavor, and Metformin and related  Family History  Problem Relation Age of Onset  . Diabetes Father   . Diabetes Brother     Social History Social History   Tobacco Use  . Smoking status: Never Smoker  . Smokeless tobacco: Current User    Types: Snuff  . Tobacco comment: smokelss x 44 years (1 can every other day)  Substance Use Topics  . Alcohol use: Not Currently    Alcohol/week: 0.0 standard drinks    Comment: OCC SIP MOONSHINE  . Drug use: No    Review of Systems  Review of Systems  Constitutional: Positive for fatigue. Negative for chills and fever.  HENT: Negative for sore throat.   Respiratory: Negative for shortness of breath.   Cardiovascular: Negative for chest pain.  Gastrointestinal: Positive for abdominal  distention, abdominal pain, constipation, nausea and vomiting.  Genitourinary: Negative for flank pain.  Musculoskeletal: Negative for neck pain.  Skin: Negative for rash and wound.  Allergic/Immunologic: Negative for immunocompromised state.  Neurological: Negative for weakness and numbness.  Hematological: Does not bruise/bleed easily.  All other systems reviewed and are negative.    ____________________________________________  PHYSICAL EXAM:      VITAL SIGNS: ED Triage Vitals  Enc Vitals Group     BP 08/10/20 0658 112/67     Pulse Rate 08/10/20 0658 71     Resp 08/10/20 0658 18     Temp 08/10/20 0658 98 F (36.7 C)     Temp Source 08/10/20 0658 Oral     SpO2 08/10/20 0658 92 %  Weight 08/10/20 0659 294 lb (133.4 kg)     Height 08/10/20 0659 6' (1.829 m)     Head Circumference --      Peak Flow --      Pain Score 08/10/20 0715 0     Pain Loc --      Pain Edu? --      Excl. in [place]? --      Physical Exam Vitals and nursing note reviewed.  Constitutional:      General: He is not in acute distress.    Appearance: He is well-developed.  HENT:     Head: Normocephalic and atraumatic.  Eyes:     Conjunctiva/sclera: Conjunctivae normal.  Cardiovascular:     Rate and Rhythm: Normal rate and regular rhythm.     Heart sounds: Normal heart sounds. No murmur heard. No friction rub.  Pulmonary:     Effort: Pulmonary effort is normal. No respiratory distress.     Breath sounds: Normal breath sounds. No wheezing or rales.  Abdominal:     General: There is distension.     Palpations: Abdomen is soft.     Tenderness: There is no abdominal tenderness. There is no guarding or rebound.     Hernia: No hernia is present.  Musculoskeletal:     Cervical back: Neck supple.  Skin:    General: Skin is warm.     Capillary Refill: Capillary refill takes less than 2 seconds.  Neurological:     Mental Status: He is alert and oriented to person, place, and time.     Motor: No  abnormal muscle tone.       ____________________________________________   LABS (all labs ordered are listed, but only abnormal results are displayed)  Labs Reviewed  COMPREHENSIVE METABOLIC PANEL - Abnormal; Notable for the following components:      Result Value   Glucose, Bld 110 (*)    Calcium 8.7 (*)    All other components within normal limits  URINALYSIS, COMPLETE (UACMP) WITH MICROSCOPIC - Abnormal; Notable for the following components:   Color, Urine STRAW (*)    APPearance CLEAR (*)    Glucose, UA >=500 (*)    All other components within normal limits  LIPASE, BLOOD  CBC  TROPONIN I (HIGH SENSITIVITY)    ____________________________________________  EKG: Normal sinus rhythm, ventricular rate 74.  QRS 97, QTc 426.  Low voltage in the precordial leads.  No acute ST elevations or depressions. ________________________________________  RADIOLOGY All imaging, including plain films, CT scans, and ultrasounds, independently reviewed by me, and interpretations confirmed via formal radiology reads.  ED MD interpretation:   CT A/P:  No acute abnormality, small inguinal hernias w/o obstruction  Official radiology report(s): CT ABDOMEN PELVIS W CONTRAST  Result Date: 08/10/2020 CLINICAL DATA:  Abdominal distension, bowel chain, vomiting and diarrhea. EXAM: CT ABDOMEN AND PELVIS WITH CONTRAST TECHNIQUE: Multidetector CT imaging of the abdomen and pelvis was performed using the standard protocol following bolus administration of intravenous contrast. CONTRAST:  114mL OMNIPAQUE IOHEXOL 300 MG/ML  SOLN COMPARISON:  CT of the abdomen and pelvis with contrast on 02/25/2015 FINDINGS: Lower chest: No acute abnormality. Hepatobiliary: No focal liver abnormality is seen. No gallstones, gallbladder wall thickening, or biliary dilatation. Pancreas: Unremarkable. No pancreatic ductal dilatation or surrounding inflammatory changes. Spleen: Normal in size without focal abnormality.  Adrenals/Urinary Tract: Adrenal glands are unremarkable. Kidneys are normal, without renal calculi, focal lesion, or hydronephrosis. Bladder is unremarkable. Stomach/Bowel: No evidence of bowel obstruction, ileus, inflammatory  process, lesion or free intraperitoneal air. The appendix is not visualized and may have been previously removed. Vascular/Lymphatic: No significant vascular findings are present. No enlarged abdominal or pelvic lymph nodes. Reproductive: Prostate is unremarkable. Other: Stable small bilateral inguinal hernias containing fat. No ascites or abscess identified. Musculoskeletal: No acute or significant osseous findings. IMPRESSION: 1. No acute findings in the abdomen or pelvis. 2. Stable small bilateral inguinal hernias containing fat. Electronically Signed   By: Aletta Edouard M.D.   On: 08/10/2020 08:48    ____________________________________________  PROCEDURES   Procedure(s) performed (including Critical Care):  Procedures  ____________________________________________  INITIAL IMPRESSION / MDM / [place] / ED COURSE  As part of my medical decision making, I reviewed the following data within the [place] notes reviewed and incorporated, Old chart reviewed, Notes from prior ED visits, and [place] Controlled Substance Database       *Patrick Burns was evaluated in Emergency Department on 08/10/2020 for the symptoms described in the history of present illness. He was evaluated in the context of the global COVID-19 pandemic, which necessitated consideration that the patient might be at risk for infection with the SARS-CoV-2 virus that causes COVID-19. Institutional protocols and algorithms that pertain to the evaluation of patients at risk for COVID-19 are in a state of rapid change based on information released by regulatory bodies including the CDC and federal and state organizations. These policies and algorithms were followed during  the patient's care in the ED.  Some ED evaluations and interventions may be delayed as a result of limited staffing during the pandemic.*     Medical Decision Making:  Pleasant 59 yo M here with n/v, belching, intermittent abd cramping. On exam, pt in NAD. Abdomen soft, NT. No rebound or guarding. Labs show no leukocytosis. LFTs, renal function are normal. Lipase wnl. UA without signs of UTI. CT obtained given pt's history, reviewed by me and is unremarkable. EKG nonischemic and trop neg, doubt referred cardiac sx. Clinically, suspect viral GI vs food borne illness, DDx includes GERD/gastritis w/ belching. Will trial antacids, bentyl, outpt follow-up. Return precautions given.  ____________________________________________  FINAL CLINICAL IMPRESSION(S) / ED DIAGNOSES  Final diagnoses:  Belching  Slow transit constipation     MEDICATIONS GIVEN DURING THIS VISIT:  Medications  lactated ringers bolus 1,000 mL (0 mLs Intravenous Stopped 08/10/20 0909)  ondansetron (ZOFRAN) injection 4 mg (4 mg Intravenous Given 08/10/20 0754)  alum & mag hydroxide-simeth (MAALOX/MYLANTA) 200-200-20 MG/5ML suspension 30 mL (30 mLs Oral Given 08/10/20 0754)    And  lidocaine (XYLOCAINE) 2 % viscous mouth solution 15 mL (15 mLs Oral Given 08/10/20 0754)  iohexol (OMNIPAQUE) 300 MG/ML solution 125 mL (125 mLs Intravenous Contrast Given 08/10/20 0833)     ED Discharge Orders         Ordered    docusate sodium (COLACE) 100 MG capsule  2 times daily        08/10/20 0911    ondansetron (ZOFRAN ODT) 4 MG disintegrating tablet  Every 8 hours PRN        08/10/20 0911    polyethylene glycol (MIRALAX) 17 g packet        08/10/20 0911           Note:  This document was prepared using Dragon voice recognition software and may include unintentional dictation errors.   Duffy Bruce, MD 08/10/20 [phone]

## 2020-08-10 NOTE — ED Triage Notes (Signed)
C/O 3 day history of belching and gas.  2 days ago had some vomiting and diarrhea.  AAOx3.  Skin warm and dry. NAD

## 2020-08-10 NOTE — ED Notes (Signed)
Patient transported to CT 

## 2021-03-01 ENCOUNTER — Other Ambulatory Visit: Payer: Self-pay

## 2021-03-01 ENCOUNTER — Emergency Department
Admission: EM | Admit: 2021-03-01 | Discharge: 2021-03-01 | Disposition: A | Discharge status: Home / Self Care | Payer: BC Managed Care – PPO | Attending: Emergency Medicine | Admitting: Emergency Medicine

## 2021-03-01 ENCOUNTER — Encounter: Payer: Self-pay | Admitting: Radiology

## 2021-03-01 ENCOUNTER — Emergency Department: Payer: BC Managed Care – PPO

## 2021-03-01 DIAGNOSIS — E119 Type 2 diabetes mellitus without complications: Secondary | ICD-10-CM | POA: Diagnosis not present

## 2021-03-01 DIAGNOSIS — Z79899 Other long term (current) drug therapy: Secondary | ICD-10-CM | POA: Insufficient documentation

## 2021-03-01 DIAGNOSIS — Z85048 Personal history of other malignant neoplasm of rectum, rectosigmoid junction, and anus: Secondary | ICD-10-CM | POA: Insufficient documentation

## 2021-03-01 DIAGNOSIS — Z20822 Contact with and (suspected) exposure to covid-19: Secondary | ICD-10-CM | POA: Insufficient documentation

## 2021-03-01 DIAGNOSIS — Z8546 Personal history of malignant neoplasm of prostate: Secondary | ICD-10-CM | POA: Insufficient documentation

## 2021-03-01 DIAGNOSIS — I10 Essential (primary) hypertension: Secondary | ICD-10-CM | POA: Insufficient documentation

## 2021-03-01 DIAGNOSIS — Z8551 Personal history of malignant neoplasm of bladder: Secondary | ICD-10-CM | POA: Diagnosis not present

## 2021-03-01 DIAGNOSIS — J069 Acute upper respiratory infection, unspecified: Secondary | ICD-10-CM | POA: Diagnosis not present

## 2021-03-01 DIAGNOSIS — R059 Cough, unspecified: Secondary | ICD-10-CM | POA: Diagnosis present

## 2021-03-01 DIAGNOSIS — J45909 Unspecified asthma, uncomplicated: Secondary | ICD-10-CM | POA: Diagnosis not present

## 2021-03-01 DIAGNOSIS — Z7951 Long term (current) use of inhaled steroids: Secondary | ICD-10-CM | POA: Insufficient documentation

## 2021-03-01 DIAGNOSIS — Z1152 Encounter for screening for COVID-19: Secondary | ICD-10-CM

## 2021-03-01 LAB — COMPREHENSIVE METABOLIC PANEL
ALT: 19 U/L (ref 0–44)
AST: 17 U/L (ref 15–41)
Albumin: 4 g/dL (ref 3.5–5.0)
Alkaline Phosphatase: 53 U/L (ref 38–126)
Anion gap: 8 (ref 5–15)
BUN: 15 mg/dL (ref 6–20)
CO2: 28 mmol/L (ref 22–32)
Calcium: 8.9 mg/dL (ref 8.9–10.3)
Chloride: 100 mmol/L (ref 98–111)
Creatinine, Ser: 0.91 mg/dL (ref 0.61–1.24)
GFR, Estimated: >60 mL/min (ref >60)
Glucose, Bld: 130 mg/dL — ABNORMAL HIGH (ref 70–99)
Potassium: 3.8 mmol/L (ref 3.5–5.1)
Sodium: 136 mmol/L (ref 135–145)
Total Bilirubin: 1.2 mg/dL (ref 0.3–1.2)
Total Protein: 7.1 g/dL (ref 6.5–8.1)

## 2021-03-01 LAB — RESP PANEL BY RT-PCR (FLU A&B, COVID) ARPGX2
Influenza A by PCR: NEGATIVE
Influenza B by PCR: NEGATIVE
SARS Coronavirus 2 by RT PCR: NEGATIVE

## 2021-03-01 LAB — CBC WITH DIFFERENTIAL/PLATELET
Abs Immature Granulocytes: 0.08 10*3/uL — ABNORMAL HIGH (ref 0.00–0.07)
Basophils Absolute: 0.1 10*3/uL (ref 0.0–0.1)
Basophils Relative: 0 %
Eosinophils Absolute: 0.3 10*3/uL (ref 0.0–0.5)
Eosinophils Relative: 2 %
HCT: 43.5 % (ref 39.0–52.0)
Hemoglobin: 14.3 g/dL (ref 13.0–17.0)
Immature Granulocytes: 1 %
Lymphocytes Relative: 9 %
Lymphs Abs: 1.3 10*3/uL (ref 0.7–4.0)
MCH: 28.5 pg (ref 26.0–34.0)
MCHC: 32.9 g/dL (ref 30.0–36.0)
MCV: 86.7 fL (ref 80.0–100.0)
Monocytes Absolute: 0.7 10*3/uL (ref 0.1–1.0)
Monocytes Relative: 5 %
Neutro Abs: 12.7 10*3/uL — ABNORMAL HIGH (ref 1.7–7.7)
Neutrophils Relative %: 83 %
Platelets: 210 10*3/uL (ref 150–400)
RBC: 5.02 MIL/uL (ref 4.22–5.81)
RDW: 12.8 % (ref 11.5–15.5)
WBC: 15.1 10*3/uL — ABNORMAL HIGH (ref 4.0–10.5)
nRBC: 0 % (ref 0.0–0.2)

## 2021-03-01 LAB — CBG MONITORING, ED: Glucose-Capillary: 138 mg/dL — ABNORMAL HIGH (ref 70–99)

## 2021-03-01 MED ORDER — ACETAMINOPHEN 325 MG PO TABS
650.0000 mg | ORAL_TABLET | Freq: Once | ORAL | Status: AC | PRN
  Administered 2021-03-01: 05:00:00 650 mg via ORAL
  Filled 2021-03-01: qty 2

## 2021-03-01 MED ORDER — DOXYCYCLINE HYCLATE 100 MG PO CAPS: 100.0000 mg | ORAL_CAPSULE | Freq: Two times a day (BID) | ORAL | 0 refills | Status: DC

## 2021-03-01 MED ORDER — BENZONATATE 100 MG PO CAPS: ORAL_CAPSULE | ORAL | 0 refills | Status: DC

## 2021-03-01 NOTE — ED Provider Notes (Signed)
[facility] Emergency Department Provider Note   ____________________________________________   Event Date/Time   First MD Initiated Contact with Patient 03/01/21 [id number]     (approximate)  I have reviewed the triage vital signs and the nursing notes.   HISTORY  Chief Complaint No chief complaint on file.    HPI AZIM GILLINGHAM is a 60 y.o. male presents to the ED with complaint of not feeling well last evening.  Patient states that he was treated for a upper respiratory infection 3 weeks ago in [place].  He states that at that time his COVID test was negative.  Patient feels that he is "dehydrated".  Wife is present and states that his temperature was 101 last evening but no over-the-counter medication was given.  He continues to have a dry cough and continues with his chronic diarrhea.  He denies any nausea or vomiting.  Patient reports that he has had the COVID-vaccine and is unaware of any known COVID exposure.  Patient denies any tick bites.  Currently he is taking Tessalon for his cough and is almost out of this prescription.        Past Medical History:  Diagnosis Date   Asthma    Blood dyscrasia    "BLOOD IS THICK"   Diabetes mellitus without complication (HCC)    Dyspnea on exertion    GERD (gastroesophageal reflux disease)    Headache    MIGRAINES   Hypertension    Hypogonadism male    Leaky heart valve    Metastatic cancer ([place]) 2006   Rectum --> prostate --> bladder/chemo and radiation/ [facility]/squamous cell carcinoma anal rectum area   Peripheral edema    Sleep apnea    NOT USING CPAP CURRENTLY   Umbilical hernia     Patient Active Problem List   Diagnosis Date Noted   Status post repair of ventral hernia 06/30/2020   Chronic venous insufficiency 01/02/2018   Lymphedema 01/02/2018   Essential hypertension 12/03/2017   Bilateral lower extremity edema 12/03/2017   Lower extremity pain, bilateral 12/03/2017   Hyperlipidemia  12/03/2017   Diastasis recti 04/22/2015    Past Surgical History:  Procedure Laterality Date   COLONOSCOPY  2011   [facility]   KNEE SURGERY     TONSILLECTOMY     UMBILICAL HERNIA REPAIR N/A 05/06/2015   Procedure: HERNIA REPAIR UMBILICAL ADULT;  Surgeon: Christene Lye, MD;  Location: [facility] ORS;  Service: General;  Laterality: N/A;    Prior to Admission medications   Medication Sig Start Date End Date Taking? Authorizing Provider  benzonatate (TESSALON PERLES) 100 MG capsule 1-2 every 8 hours prn cough 03/01/21  Yes Letitia Neri L, PA-C  doxycycline (VIBRAMYCIN) 100 MG capsule Take 1 capsule (100 mg total) by mouth 2 (two) times daily. 03/01/21  Yes Letitia Neri L, PA-C  albuterol (VENTOLIN HFA) 108 (90 BASE) MCG/ACT inhaler Inhale 2 puffs into the lungs every 4 (four) hours as needed.  12/21/11   [provider]  amLODipine-benazepril (LOTREL) 5-20 MG capsule Take 1 capsule by mouth daily.     [provider]  Aspirin-Acetaminophen-Caffeine (EXCEDRIN MIGRAINE PO) Take 2 tablets by mouth as needed.     [provider]  cetirizine (ZYRTEC) 10 MG tablet Take 10 mg by mouth.    [provider]  Coenzyme Q10 (CO Q-10) 200 MG CAPS Take 1 capsule by mouth daily.    [provider]  EPINEPHrine (EPIPEN IJ) Inject 1 Dose as  directed as needed.    [provider]  famotidine (PEPCID) 20 MG tablet Take 20 mg by mouth 2 (two) times daily as needed.  04/04/19   [provider]  FARXIGA 5 MG TABS tablet Take 5 mg by mouth daily. 04/09/19   [provider]  fluticasone (FLONASE) 50 MCG/ACT nasal spray 1 SPRAY PER NARES PRN 03/04/15   [provider]  glucosamine-chondroitin 500-400 MG tablet Take 1 tablet by mouth 2 (two) times daily.    [provider]  KRILL OIL PO Take 1 capsule by mouth daily.    [provider]  Multiple Vitamin (MULTI-VITAMINS) TABS Take 1 tablet by mouth daily.     [provider]  Nutritional Supplements (QUINOA KALE & HEMP) LIQD Take 1 drop by mouth daily.    [provider]  ondansetron (ZOFRAN ODT) 4 MG disintegrating tablet Take 1 tablet (4 mg total) by mouth every 8 (eight) hours as needed for nausea or vomiting. 08/10/20   Duffy Bruce, MD  Plant Sterols and Stanols (CHOLEST OFF PO) Take 2 tablets by mouth daily.    [provider]  polyethylene glycol (MIRALAX) 17 g packet Take twice a day until BM are soft, then once a day as needed for constipation. 08/10/20   Duffy Bruce, MD  QVAR REDIHALER 80 MCG/ACT inhaler Inhale 1 puff into the lungs daily. 04/04/19   [provider]  rosuvastatin (CRESTOR) 10 MG tablet Take 10 mg by mouth at bedtime. 06/28/20   [provider]  RYBELSUS 7 MG TABS Take 1 tablet by mouth at bedtime. 05/31/20   [provider]  sildenafil (REVATIO) 20 MG tablet Take 20 mg by mouth daily as needed. 03/02/20   [provider]  sildenafil (VIAGRA) 100 MG tablet Take 100 mg by mouth as needed.  08/07/14   [provider]  Testosterone 1.62 % GEL Apply 1 application topically daily.  02/04/19   [provider]  vitamin E (VITAMIN E) 400 UNIT capsule Take 400 Units by mouth daily.    [provider]    Allergies Bee venom, Ibuprofen, Biaxin [clarithromycin], Mango flavor, and Metformin and related  Family History  Problem Relation Age of Onset   Diabetes Father    Diabetes Brother     Social History Social History   Tobacco Use   Smoking status: Never   Smokeless tobacco: Current    Types: Snuff   Tobacco comments:    smokelss x 44 years (1 can every other day)  Substance Use Topics   Alcohol use: Not Currently    Alcohol/week: 0.0 standard drinks    Comment: OCC SIP MOONSHINE   Drug use: No    Review of Systems Constitutional: Positive fever/positive chills Eyes: No visual changes. ENT: No sore throat. Cardiovascular: Denies chest  pain. Respiratory: Denies shortness of breath.  Positive nonproductive cough. Gastrointestinal: No abdominal pain.  No nausea, no vomiting.  Positive chronic diarrhea.   Genitourinary: Negative for dysuria. Musculoskeletal: Negative for muscle aches. Skin: Negative for rash. Neurological: Negative for headaches, focal weakness or numbness.   ____________________________________________   PHYSICAL EXAM:  VITAL SIGNS: ED Triage Vitals  Enc Vitals Group     BP 03/01/21 0443 118/78     Pulse Rate 03/01/21 0440 (!) 118     Resp 03/01/21 0440 (!) 22     Temp 03/01/21 0440 (!) 100.4 F (38 C)     Temp Source 03/01/21 0440 Oral  SpO2 03/01/21 0440 92 %     Weight --      Height --      Head Circumference --      Peak Flow --      Pain Score --      Pain Loc --      Pain Edu? --      Excl. in [place]? --     Constitutional: Alert and oriented. Well appearing and in no acute distress. Eyes: Conjunctivae are normal.  Head: Atraumatic. Nose: No congestion/rhinnorhea. Neck: No stridor.   Cardiovascular: Normal rate, regular rhythm. Grossly normal heart sounds.  Good peripheral circulation. Respiratory: Normal respiratory effort.  No retractions. Lungs no wheezing is noted however there is a coarse cough noted during exam.  Patient is able to talk in complete sentences without any difficulty. Gastrointestinal: Soft and nontender. No distention.  Bowel sounds normoactive x4 quadrants. Musculoskeletal: Moves upper and lower extremities any difficulty.  Normal gait was noted. Neurologic:  Normal speech and language. No gross focal neurologic deficits are appreciated. No gait instability. Skin:  Skin is warm, dry and intact. No rash noted. Psychiatric: Mood and affect are normal. Speech and behavior are normal.  ____________________________________________   LABS (all labs ordered are listed, but only abnormal results are displayed)  Labs Reviewed  CBC WITH DIFFERENTIAL/PLATELET -  Abnormal; Notable for the following components:      Result Value   WBC 15.1 (*)    Neutro Abs 12.7 (*)    Abs Immature Granulocytes 0.08 (*)    All other components within normal limits  COMPREHENSIVE METABOLIC PANEL - Abnormal; Notable for the following components:   Glucose, Bld 130 (*)    All other components within normal limits  CBG MONITORING, ED - Abnormal; Notable for the following components:   Glucose-Capillary 138 (*)    All other components within normal limits  RESP PANEL BY RT-PCR (FLU A&B, COVID) [id number]   ____________________________________________  EKG   ____________________________________________  RADIOLOGY Leana Gamer, personally viewed and evaluated these images (plain radiographs) as part of my medical decision making, as well as reviewing the written report by the radiologist.   Official radiology report(s): DG Chest 2 View  Result Date: 03/01/2021 CLINICAL DATA:  60 year old male with history of cough and fever. EXAM: CHEST - 2 VIEW COMPARISON:  Chest x-ray 06/29/2018. FINDINGS: Lung volumes are low. No consolidative airspace disease. No pleural effusions. No pneumothorax. No pulmonary nodule or mass noted. Pulmonary vasculature and the cardiomediastinal silhouette are within normal limits. IMPRESSION: 1. Low lung volumes without radiographic evidence of acute cardiopulmonary disease. Electronically Signed   By: Vinnie Langton M.D.   On: 03/01/2021 05:09    ____________________________________________   PROCEDURES  Procedure(s) performed (including Critical Care):  Procedures   ____________________________________________   INITIAL IMPRESSION / ASSESSMENT AND PLAN / ED COURSE  As part of my medical decision making, I reviewed the following data within the electronic MEDICAL RECORD NUMBER Notes from prior ED visits and [place] Controlled Substance Database  60 year old male presents to the ED with complaint of cough and upper respiratory symptoms  for approximately 3 weeks.  Patient states that he was seen initially in [place] where his COVID test was negative.  He states he began feeling better and is continue taking the Tessalon for his cough.  Last evening he began feeling warm and wife reports that his temperature was 101.  He denies any other symptoms other than respiratory.  Chest x-ray was  reassuring.  White count was elevated at 15.  Respiratory panel including COVID and influenza were both negative.  Patient was placed on doxycycline 100 mg twice daily for 10 days he is to continue using his albuterol inhaler and a prescription for Tessalon Perles 100 mg 1 or 2 every 8 hours as needed for cough.  Patient is strongly encouraged to follow-up with his PCP for reevaluation and follow-up.  At the time of discharge patient was afebrile and an O2 sat of 95%.  Upper respiratory infection/early bronchitis.  ____________________________________________   FINAL CLINICAL IMPRESSION(S) / ED DIAGNOSES  Final diagnoses:  Upper respiratory infection, acute  Encounter for screening for COVID-19     ED Discharge Orders          Ordered    benzonatate (TESSALON PERLES) 100 MG capsule        03/01/21 0849    doxycycline (VIBRAMYCIN) 100 MG capsule  2 times daily        03/01/21 0849             Note:  This document was prepared using Dragon voice recognition software and may include unintentional dictation errors.    Johnn Hai, PA-C 03/01/21 0272    Lucrezia Starch, MD 03/01/21 [phone]

## 2021-03-01 NOTE — ED Notes (Signed)
See triage note  States he developed chills last pm with fever  States he also has had a cough for the past 3 weeks  Was seen in [place] for same

## 2021-03-01 NOTE — Discharge Instructions (Signed)
Call make an up follow-up appointment with your primary care provider.  Begin taking doxycycline 100 mg twice daily until completely finished.  You will need to drink lots of fluids and continue taking Tylenol or ibuprofen as needed for fever, body aches or headache.  Use your albuterol inhaler as needed for coughing and wheezing.  A prescription for Ladona Ridgel was also sent to your pharmacy.  This can be taken and 1 or 2 every 8 hours as needed for cough.

## 2021-03-01 NOTE — ED Triage Notes (Signed)
Pt reports he has not been feeling well since last night. States he had a temp at home 101, no meds taken for the same. Dry cough. Treated for URI about 3 weeks ago. Mild wheeze.

## 2021-05-18 ENCOUNTER — Emergency Department
Admission: EM | Admit: 2021-05-18 | Discharge: 2021-05-18 | Disposition: A | Discharge status: Home / Self Care | Payer: BC Managed Care – PPO | Attending: Emergency Medicine | Admitting: Emergency Medicine

## 2021-05-18 ENCOUNTER — Other Ambulatory Visit: Payer: Self-pay

## 2021-05-18 DIAGNOSIS — S20461A Insect bite (nonvenomous) of right back wall of thorax, initial encounter: Secondary | ICD-10-CM | POA: Insufficient documentation

## 2021-05-18 DIAGNOSIS — I10 Essential (primary) hypertension: Secondary | ICD-10-CM | POA: Diagnosis not present

## 2021-05-18 DIAGNOSIS — Z8546 Personal history of malignant neoplasm of prostate: Secondary | ICD-10-CM | POA: Insufficient documentation

## 2021-05-18 DIAGNOSIS — W57XXXA Bitten or stung by nonvenomous insect and other nonvenomous arthropods, initial encounter: Secondary | ICD-10-CM | POA: Diagnosis not present

## 2021-05-18 DIAGNOSIS — Z7951 Long term (current) use of inhaled steroids: Secondary | ICD-10-CM | POA: Diagnosis not present

## 2021-05-18 DIAGNOSIS — L03312 Cellulitis of back [any part except buttock]: Secondary | ICD-10-CM | POA: Insufficient documentation

## 2021-05-18 DIAGNOSIS — J45909 Unspecified asthma, uncomplicated: Secondary | ICD-10-CM | POA: Diagnosis not present

## 2021-05-18 DIAGNOSIS — E119 Type 2 diabetes mellitus without complications: Secondary | ICD-10-CM | POA: Insufficient documentation

## 2021-05-18 DIAGNOSIS — Z79899 Other long term (current) drug therapy: Secondary | ICD-10-CM | POA: Insufficient documentation

## 2021-05-18 MED ORDER — DOXYCYCLINE HYCLATE 100 MG PO CAPS: 100.0000 mg | ORAL_CAPSULE | Freq: Two times a day (BID) | ORAL | 0 refills | Status: AC

## 2021-05-18 NOTE — ED Triage Notes (Signed)
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

## 2021-05-18 NOTE — ED Provider Notes (Signed)
[facility] Emergency Department Provider Note  ____________________________________________   Event Date/Time   First MD Initiated Contact with Patient 05/18/21 1251     (approximate)  I have reviewed the triage vital signs and the nursing notes.   HISTORY  Chief Complaint Insect Bite    HPI Patrick Burns is a 60 y.o. male  here with insect bite to back. Pt works in Biomedical [employer]. He reports that yesterday he believes he was stung by something on his R shoulder. Since then, he's had worsening red, painful, aching, throbbing rash to his left upper back along with itching. No fevers. No fluctuance. No known tick exposures. No other complaints.No fevers, chills. No h/o poor wound healing.        Past Medical History:  Diagnosis Date   Asthma    Blood dyscrasia    "BLOOD IS THICK"   Diabetes mellitus without complication (HCC)    Dyspnea on exertion    GERD (gastroesophageal reflux disease)    Headache    MIGRAINES   Hypertension    Hypogonadism male    Leaky heart valve    Metastatic cancer ([place]) 2006   Rectum --> prostate --> bladder/chemo and radiation/ [facility]/squamous cell carcinoma anal rectum area   Peripheral edema    Sleep apnea    NOT USING CPAP CURRENTLY   Umbilical hernia     Patient Active Problem List   Diagnosis Date Noted   Status post repair of ventral hernia 06/30/2020   Chronic venous insufficiency 01/02/2018   Lymphedema 01/02/2018   Essential hypertension 12/03/2017   Bilateral lower extremity edema 12/03/2017   Lower extremity pain, bilateral 12/03/2017   Hyperlipidemia 12/03/2017   Diastasis recti 04/22/2015    Past Surgical History:  Procedure Laterality Date   COLONOSCOPY  2011   [facility]   KNEE SURGERY     TONSILLECTOMY     UMBILICAL HERNIA REPAIR N/A 05/06/2015   Procedure: HERNIA REPAIR UMBILICAL ADULT;  Surgeon: Christene Lye, MD;  Location: [facility] ORS;  Service: General;  Laterality: N/A;     Prior to Admission medications   Medication Sig Start Date End Date Taking? Authorizing Provider  doxycycline (VIBRAMYCIN) 100 MG capsule Take 1 capsule (100 mg total) by mouth 2 (two) times daily for 7 days. 05/18/21 05/25/21 Yes Duffy Bruce, MD  albuterol (VENTOLIN HFA) 108 (90 BASE) MCG/ACT inhaler Inhale 2 puffs into the lungs every 4 (four) hours as needed.  12/21/11   [provider]  amLODipine-benazepril (LOTREL) 5-20 MG capsule Take 1 capsule by mouth daily.     [provider]  Aspirin-Acetaminophen-Caffeine (EXCEDRIN MIGRAINE PO) Take 2 tablets by mouth as needed.     [provider]  benzonatate (TESSALON PERLES) 100 MG capsule 1-2 every 8 hours prn cough 03/01/21   Letitia Neri L, PA-C  cetirizine (ZYRTEC) 10 MG tablet Take 10 mg by mouth.    [provider]  Coenzyme Q10 (CO Q-10) 200 MG CAPS Take 1 capsule by mouth daily.    [provider]  EPINEPHrine (EPIPEN IJ) Inject 1 Dose as directed as needed.    [provider]  famotidine (PEPCID) 20 MG tablet Take 20 mg by mouth 2 (two) times daily as needed.  04/04/19   [provider]  FARXIGA 5 MG TABS tablet Take 5 mg by mouth daily. 04/09/19   [provider]  fluticasone (FLONASE) 50 MCG/ACT nasal spray 1 SPRAY PER NARES PRN 03/04/15   [provider]  glucosamine-chondroitin 500-400 MG tablet Take 1 tablet by mouth 2 (two) times daily.    [provider]  KRILL OIL PO Take 1 capsule by mouth daily.    [provider]  Multiple Vitamin (MULTI-VITAMINS) TABS Take 1 tablet by mouth daily.     [provider]  Nutritional Supplements (QUINOA KALE & HEMP) LIQD Take 1 drop by mouth daily.    [provider]  ondansetron (ZOFRAN ODT) 4 MG disintegrating tablet Take 1 tablet (4 mg total) by mouth every 8 (eight) hours as needed for nausea or vomiting. 08/10/20   Duffy Bruce, MD  Plant Sterols and Stanols (CHOLEST OFF  PO) Take 2 tablets by mouth daily.    [provider]  polyethylene glycol (MIRALAX) 17 g packet Take twice a day until BM are soft, then once a day as needed for constipation. 08/10/20   Duffy Bruce, MD  QVAR REDIHALER 80 MCG/ACT inhaler Inhale 1 puff into the lungs daily. 04/04/19   [provider]  rosuvastatin (CRESTOR) 10 MG tablet Take 10 mg by mouth at bedtime. 06/28/20   [provider]  RYBELSUS 7 MG TABS Take 1 tablet by mouth at bedtime. 05/31/20   [provider]  sildenafil (REVATIO) 20 MG tablet Take 20 mg by mouth daily as needed. 03/02/20   [provider]  sildenafil (VIAGRA) 100 MG tablet Take 100 mg by mouth as needed.  08/07/14   [provider]  Testosterone 1.62 % GEL Apply 1 application topically daily.  02/04/19   [provider]  vitamin E (VITAMIN E) 400 UNIT capsule Take 400 Units by mouth daily.    [provider]    Allergies Bee venom, Ibuprofen, Biaxin [clarithromycin], Mango flavor, and Metformin and related  Family History  Problem Relation Age of Onset   Diabetes Father    Diabetes Brother     Social History Social History   Tobacco Use   Smoking status: Never   Smokeless tobacco: Current    Types: Snuff   Tobacco comments:    smokelss x 44 years (1 can every other day)  Substance Use Topics   Alcohol use: Not Currently    Alcohol/week: 0.0 standard drinks    Comment: OCC SIP MOONSHINE   Drug use: No    Review of Systems  Review of Systems  Constitutional:  Negative for chills and fever.  HENT:  Negative for sore throat.   Respiratory:  Negative for shortness of breath.   Cardiovascular:  Negative for chest pain.  Gastrointestinal:  Negative for abdominal pain.  Genitourinary:  Negative for flank pain.  Musculoskeletal:  Negative for neck pain.  Skin:  Positive for rash. Negative for wound.  Allergic/Immunologic: Negative for immunocompromised state.  Neurological:   Negative for weakness and numbness.  Hematological:  Does not bruise/bleed easily.  All other systems reviewed and are negative.   ____________________________________________  PHYSICAL EXAM:      VITAL SIGNS: ED Triage Vitals [05/18/21 1109]  Enc Vitals Group     BP 102/68     Pulse Rate 77     Resp 16     Temp 98.6 F (37 C)     Temp Source Oral     SpO2 95 %     Weight      Height      Head Circumference      Peak Flow      Pain Score      Pain  Loc      Pain Edu?      Excl. in [place]?      Physical Exam Vitals and nursing note reviewed.  Constitutional:      General: He is not in acute distress.    Appearance: He is well-developed.  HENT:     Head: Normocephalic and atraumatic.  Eyes:     Conjunctiva/sclera: Conjunctivae normal.  Cardiovascular:     Rate and Rhythm: Normal rate and regular rhythm.     Heart sounds: Normal heart sounds.  Pulmonary:     Effort: Pulmonary effort is normal. No respiratory distress.     Breath sounds: No wheezing.  Abdominal:     General: There is no distension.  Musculoskeletal:     Cervical back: Neck supple.  Skin:    General: Skin is warm.     Capillary Refill: Capillary refill takes less than 2 seconds.     Findings: No rash.     Comments: Approx 1.5 cm indurated, ovoid area to right upper back with central punctate bite. No fluctuance. No drainage.   Neurological:     Mental Status: He is alert and oriented to person, place, and time.     Motor: No abnormal muscle tone.      ____________________________________________   LABS (all labs ordered are listed, but only abnormal results are displayed)  Labs Reviewed - No data to display  ____________________________________________  EKG:  ________________________________________  RADIOLOGY All imaging, including plain films, CT scans, and ultrasounds, independently reviewed by me, and interpretations confirmed via formal radiology reads.  ED MD interpretation:      Official radiology report(s): No results found.  ____________________________________________  PROCEDURES   Procedure(s) performed (including Critical Care):  Procedures  ____________________________________________  INITIAL IMPRESSION / MDM / [place] / ED COURSE  As part of my medical decision making, I reviewed the following data within the [place] notes reviewed and incorporated, Old chart reviewed, Notes from prior ED visits, and [place] Controlled Substance Database       *Patrick Burns was evaluated in Emergency Department on 05/18/2021 for the symptoms described in the history of present illness. He was evaluated in the context of the global COVID-19 pandemic, which necessitated consideration that the patient might be at risk for infection with the SARS-CoV-2 virus that causes COVID-19. Institutional protocols and algorithms that pertain to the evaluation of patients at risk for COVID-19 are in a state of rapid change based on information released by regulatory bodies including the CDC and federal and state organizations. These policies and algorithms were followed during the patient's care in the ED.  Some ED evaluations and interventions may be delayed as a result of limited staffing during the pandemic.*     Medical Decision Making:  60 yo M here with rash to upper back. Likely insect bite, query possible tick bite. No signs of systemic RMSF. No signs of systemic illness or sepsis. No fluctuance or abscess. Will treat with doxy, d/c with outpt follow-up. Return precautions given. No signs of nec fasc or deeper infection. Pt is HDS and well appearing.  ____________________________________________  FINAL CLINICAL IMPRESSION(S) / ED DIAGNOSES  Final diagnoses:  Insect bite, unspecified site, initial encounter  Cellulitis of back except buttock     MEDICATIONS GIVEN DURING THIS VISIT:  Medications - No data to display   ED  Discharge Orders          Ordered    doxycycline (VIBRAMYCIN)  100 MG capsule  2 times daily        05/18/21 1322             Note:  This document was prepared using Dragon voice recognition software and may include unintentional dictation errors.   Duffy Bruce, MD 05/18/21 1328

## 2021-05-18 NOTE — ED Notes (Signed)
E-signature not working at this time. Pt verbalized understanding of D/C instructions, prescriptions and follow up care with no further questions at this time. Pt in NAD and ambulatory at time of D/C.  

## 2021-08-06 IMAGING — CT CT ABD-PELV W/ CM
2 of 5 series · 17 of 46 positions shown, 19 images · IV contrast (APPLIED)
Comparison: CT of the abdomen and pelvis with contrast on
02/25/2015

CLINICAL DATA: Abdominal distension, bowel chain, vomiting and
diarrhea.

EXAM:
CT ABDOMEN AND PELVIS WITH CONTRAST
TECHNIQUE: Multidetector CT imaging of the abdomen and pelvis was performed
using the standard protocol following bolus administration of
intravenous contrast.
CONTRAST:  125mL OMNIPAQUE IOHEXOL 300 MG/ML  SOLN

[Series 2: routine abd/pel with · axial · 0.98mm/px · z∈[-407,+98]mm · 14 of 113 slices shown, 16 images]
[im 6/113  soft-tissue]
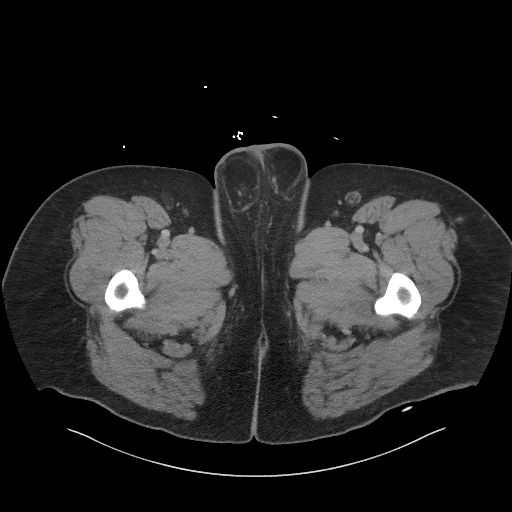
[im 6/113  bone]
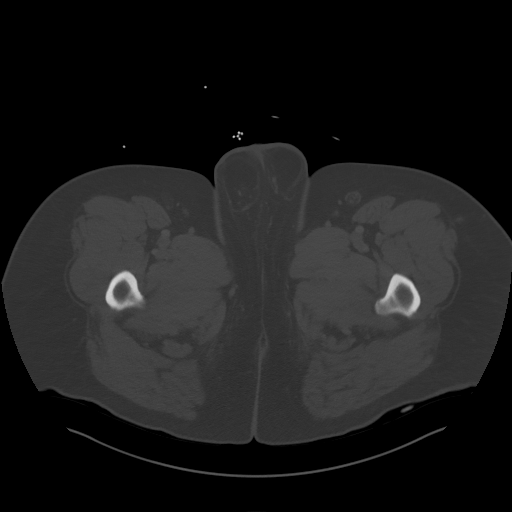
[im 12/113  soft-tissue]
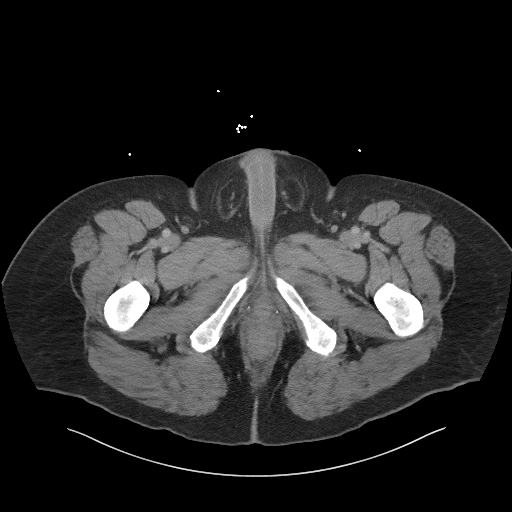
[im 24/113  soft-tissue]
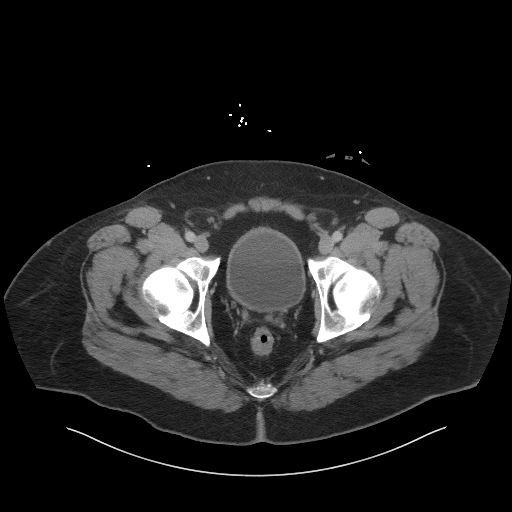
[im 30/113  soft-tissue]
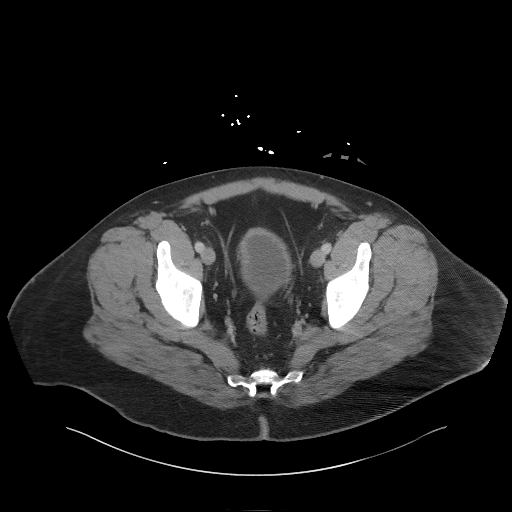
[im 36/113  soft-tissue]
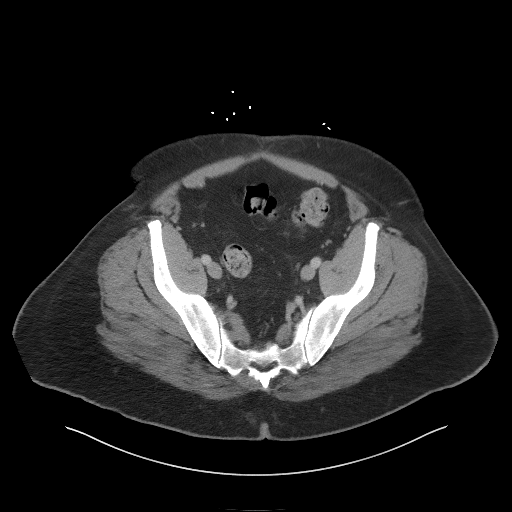
[im 48/113  soft-tissue]
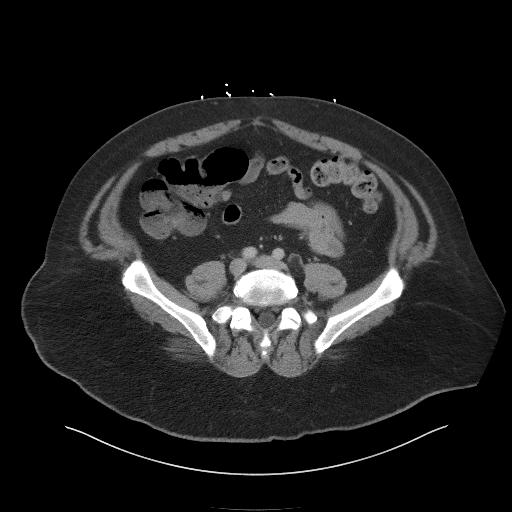
[im 54/113  soft-tissue]
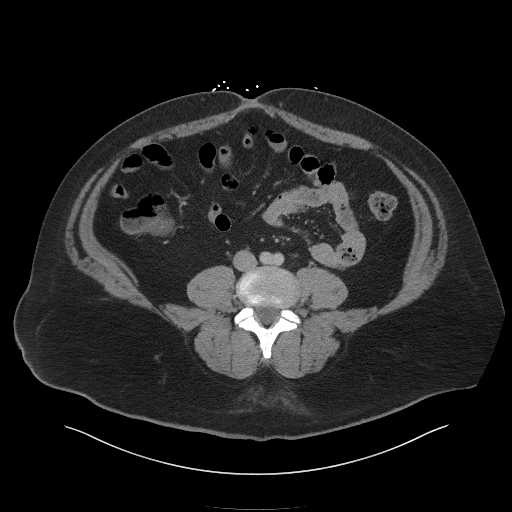
[im 59/113  soft-tissue]
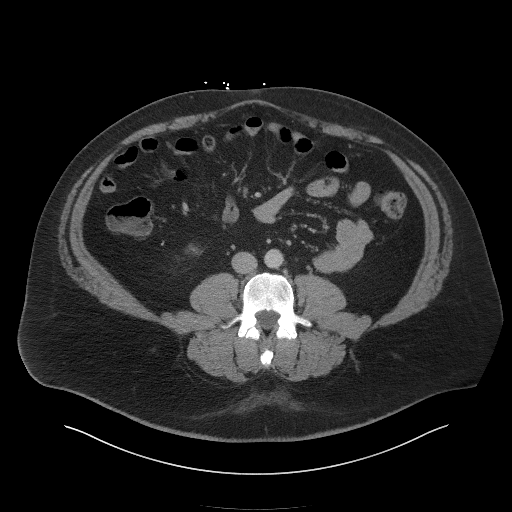
[im 65/113  soft-tissue]
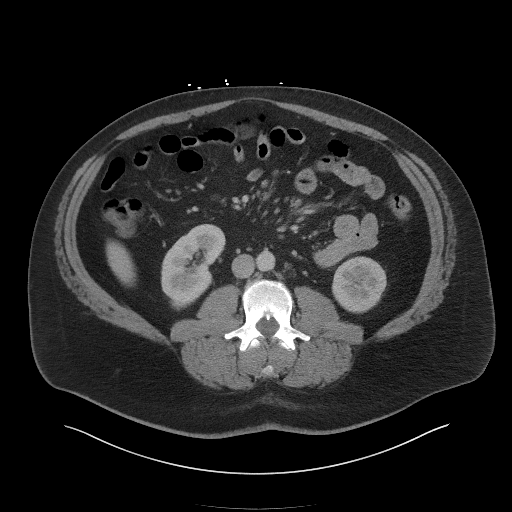
[im 65/113  bone]
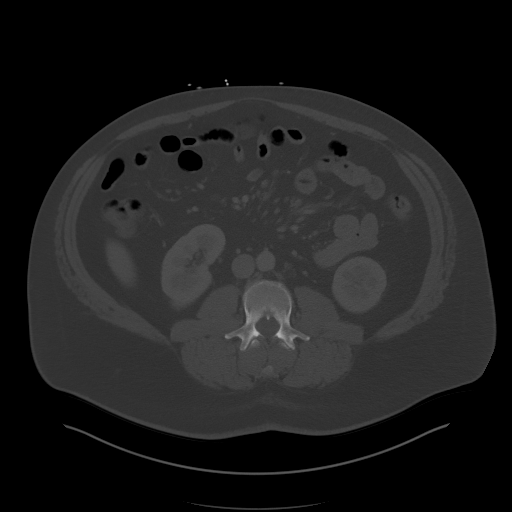
[im 77/113  soft-tissue]
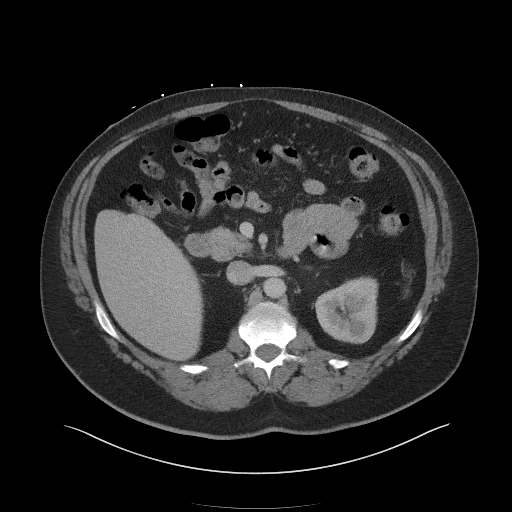
[im 83/113  soft-tissue]
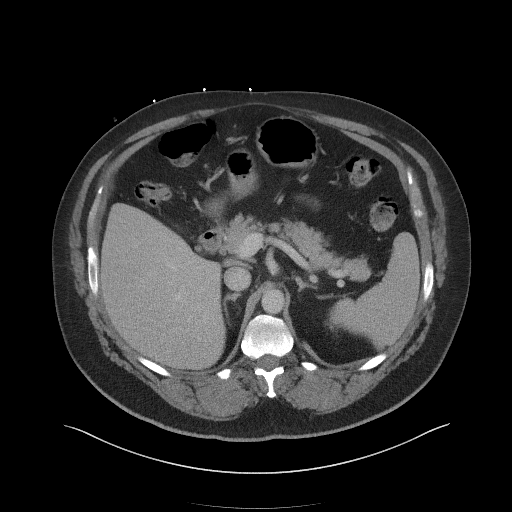
[im 89/113  soft-tissue]
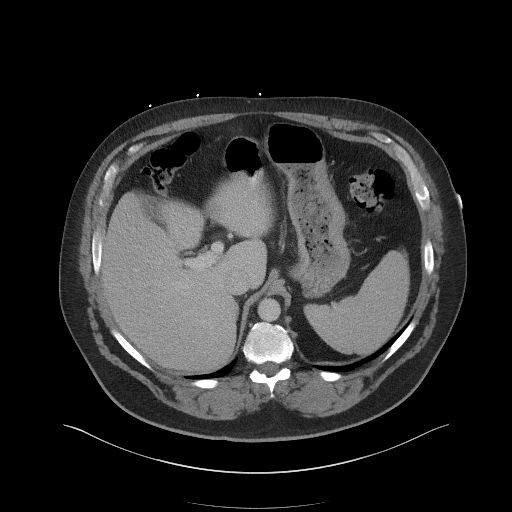
[im 101/113  soft-tissue]
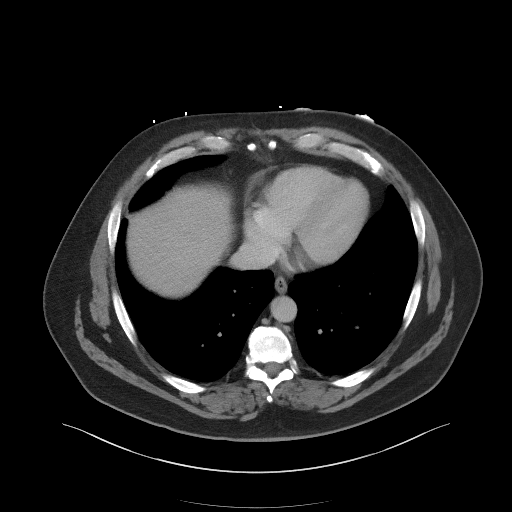
[im 107/113  soft-tissue]
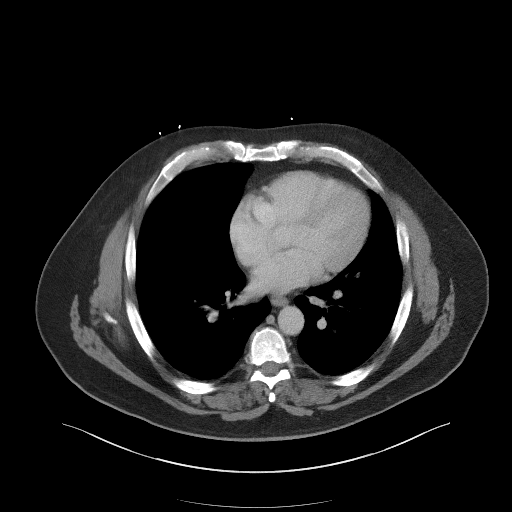

[Series 5: coronal st · coronal · 0.98mm/px · 3 of 119 slices shown]
[im 40/119  soft-tissue]
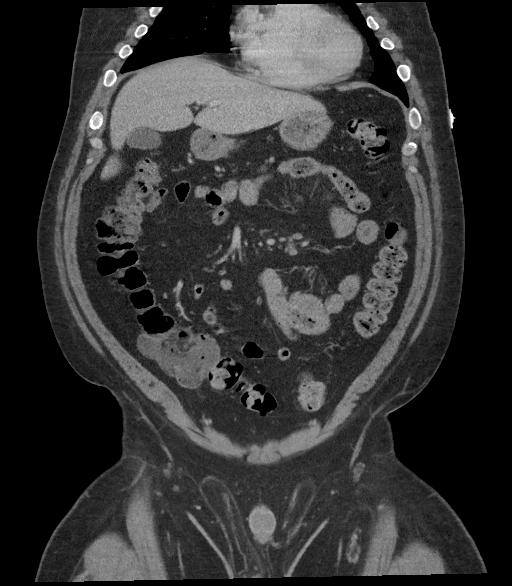
[im 53/119  soft-tissue]
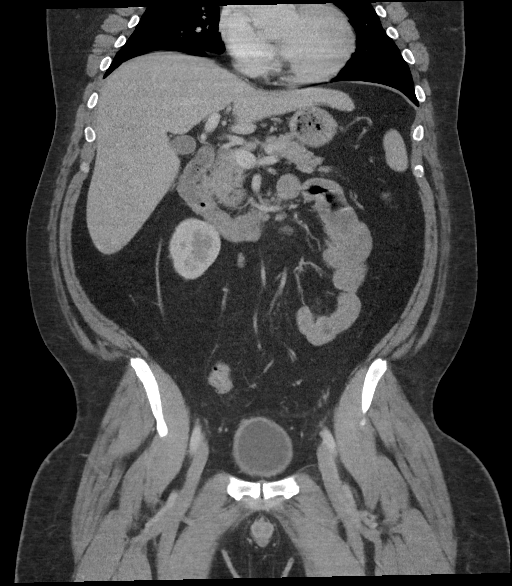
[im 66/119  soft-tissue]
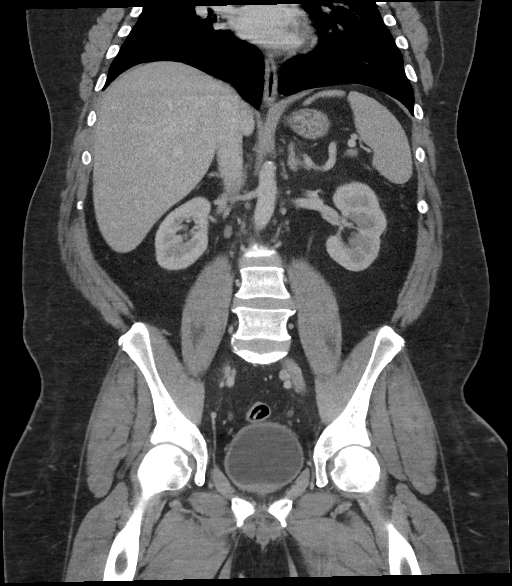

[17 of 46 positions shown; findings below may reference images not displayed]

FINDINGS: Lower chest: No acute abnormality.

Hepatobiliary: No focal liver abnormality is seen. No gallstones,
gallbladder wall thickening, or biliary dilatation.

Pancreas: Unremarkable. No pancreatic ductal dilatation or
surrounding inflammatory changes.

Spleen: Normal in size without focal abnormality.

Adrenals/Urinary Tract: Adrenal glands are unremarkable. Kidneys are
normal, without renal calculi, focal lesion, or hydronephrosis.
Bladder is unremarkable.

Stomach/Bowel: No evidence of bowel obstruction, ileus, inflammatory
process, lesion or free intraperitoneal air. The appendix is not
visualized and may have been previously removed.

Vascular/Lymphatic: No significant vascular findings are present. No
enlarged abdominal or pelvic lymph nodes.

Reproductive: Prostate is unremarkable.

Other: Stable small bilateral inguinal hernias containing fat. No
ascites or abscess identified.

Musculoskeletal: No acute or significant osseous findings.
IMPRESSION: 1. No acute findings in the abdomen or pelvis.
2. Stable small bilateral inguinal hernias containing fat.

## 2022-02-25 IMAGING — CR DG CHEST 2V
2 series · 2 of 2 positions shown · non-contrast
Comparison: Chest x-ray 06/29/2018.

CLINICAL DATA: 59-year-old male with history of cough and fever.

EXAM:
CHEST - 2 VIEW

[chest pa]
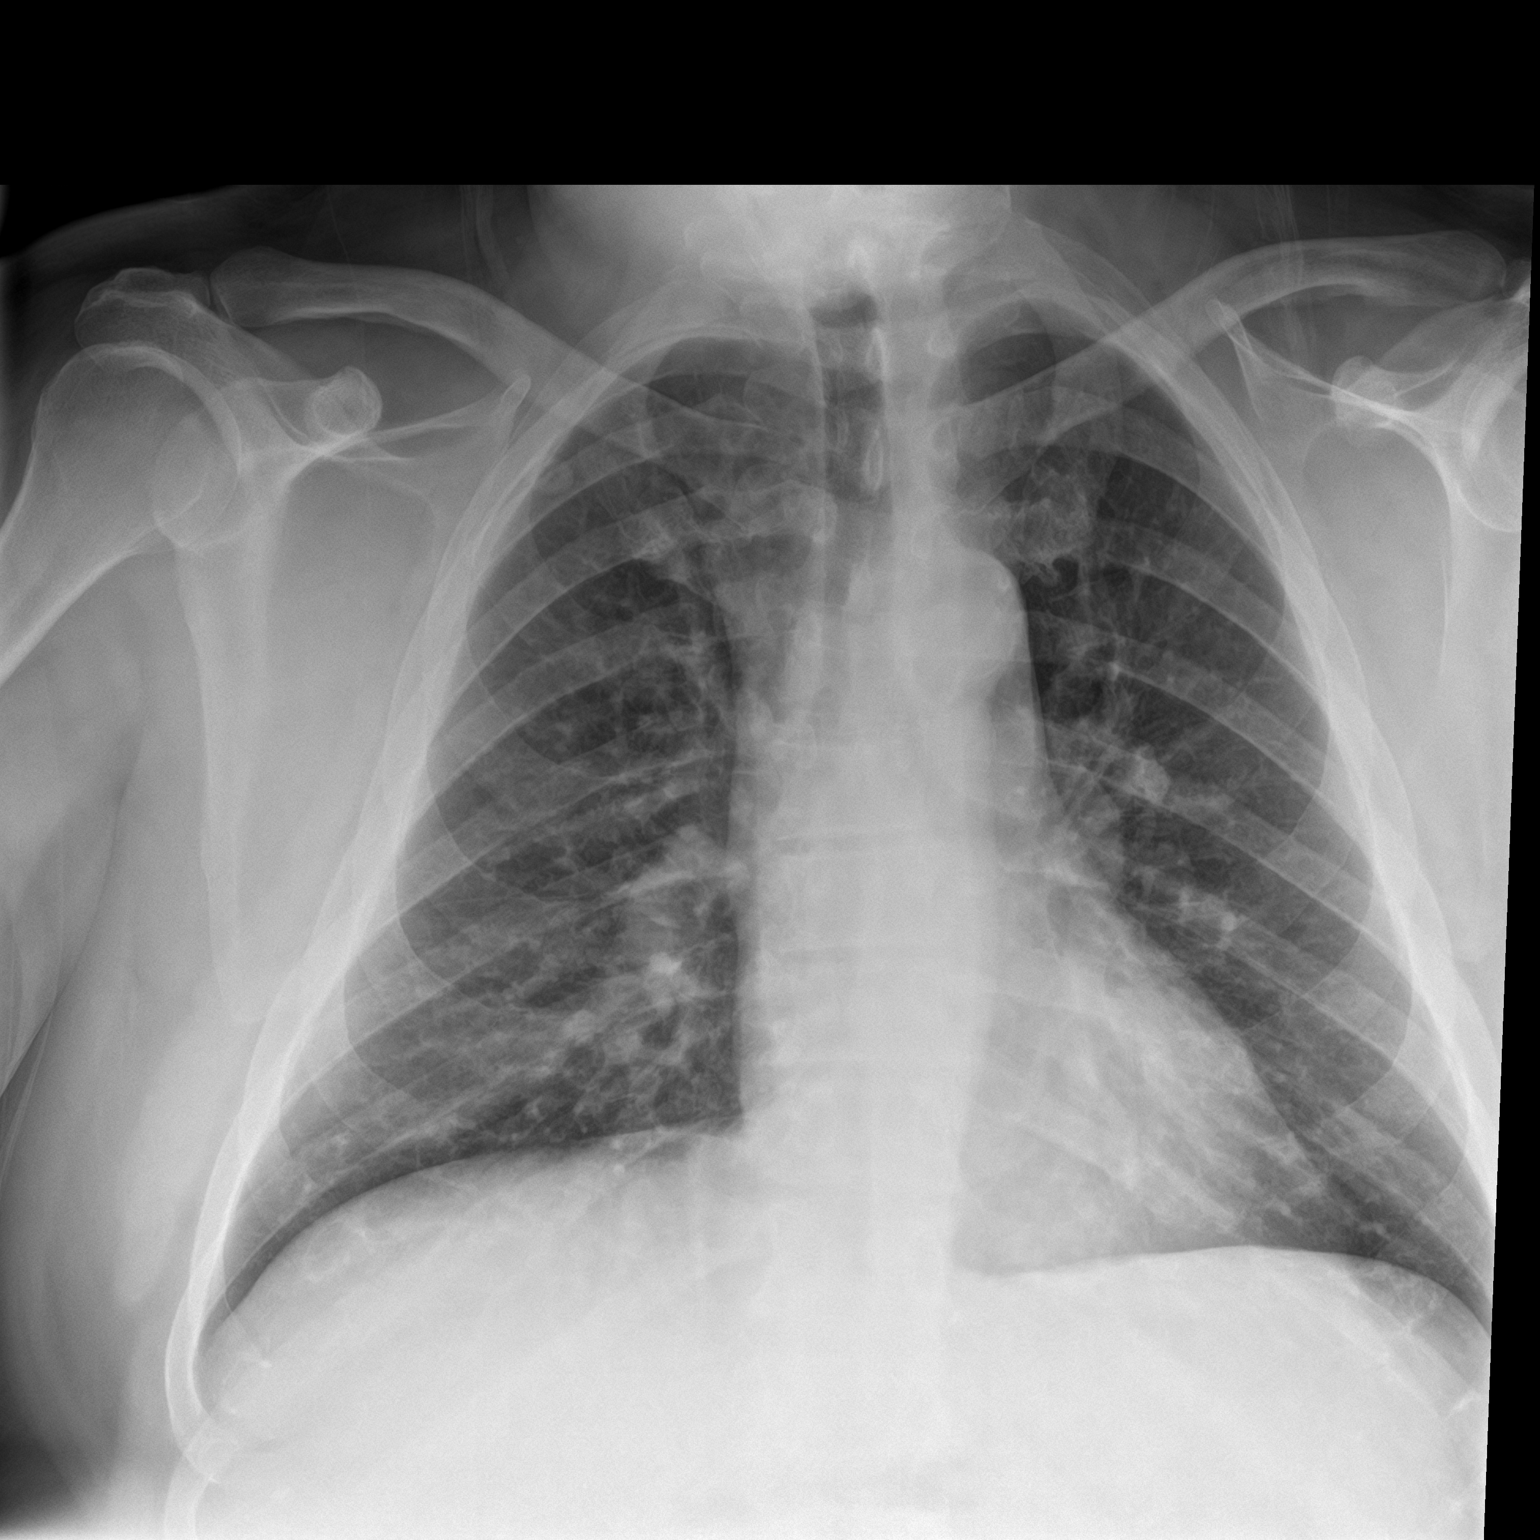

[chest lat]
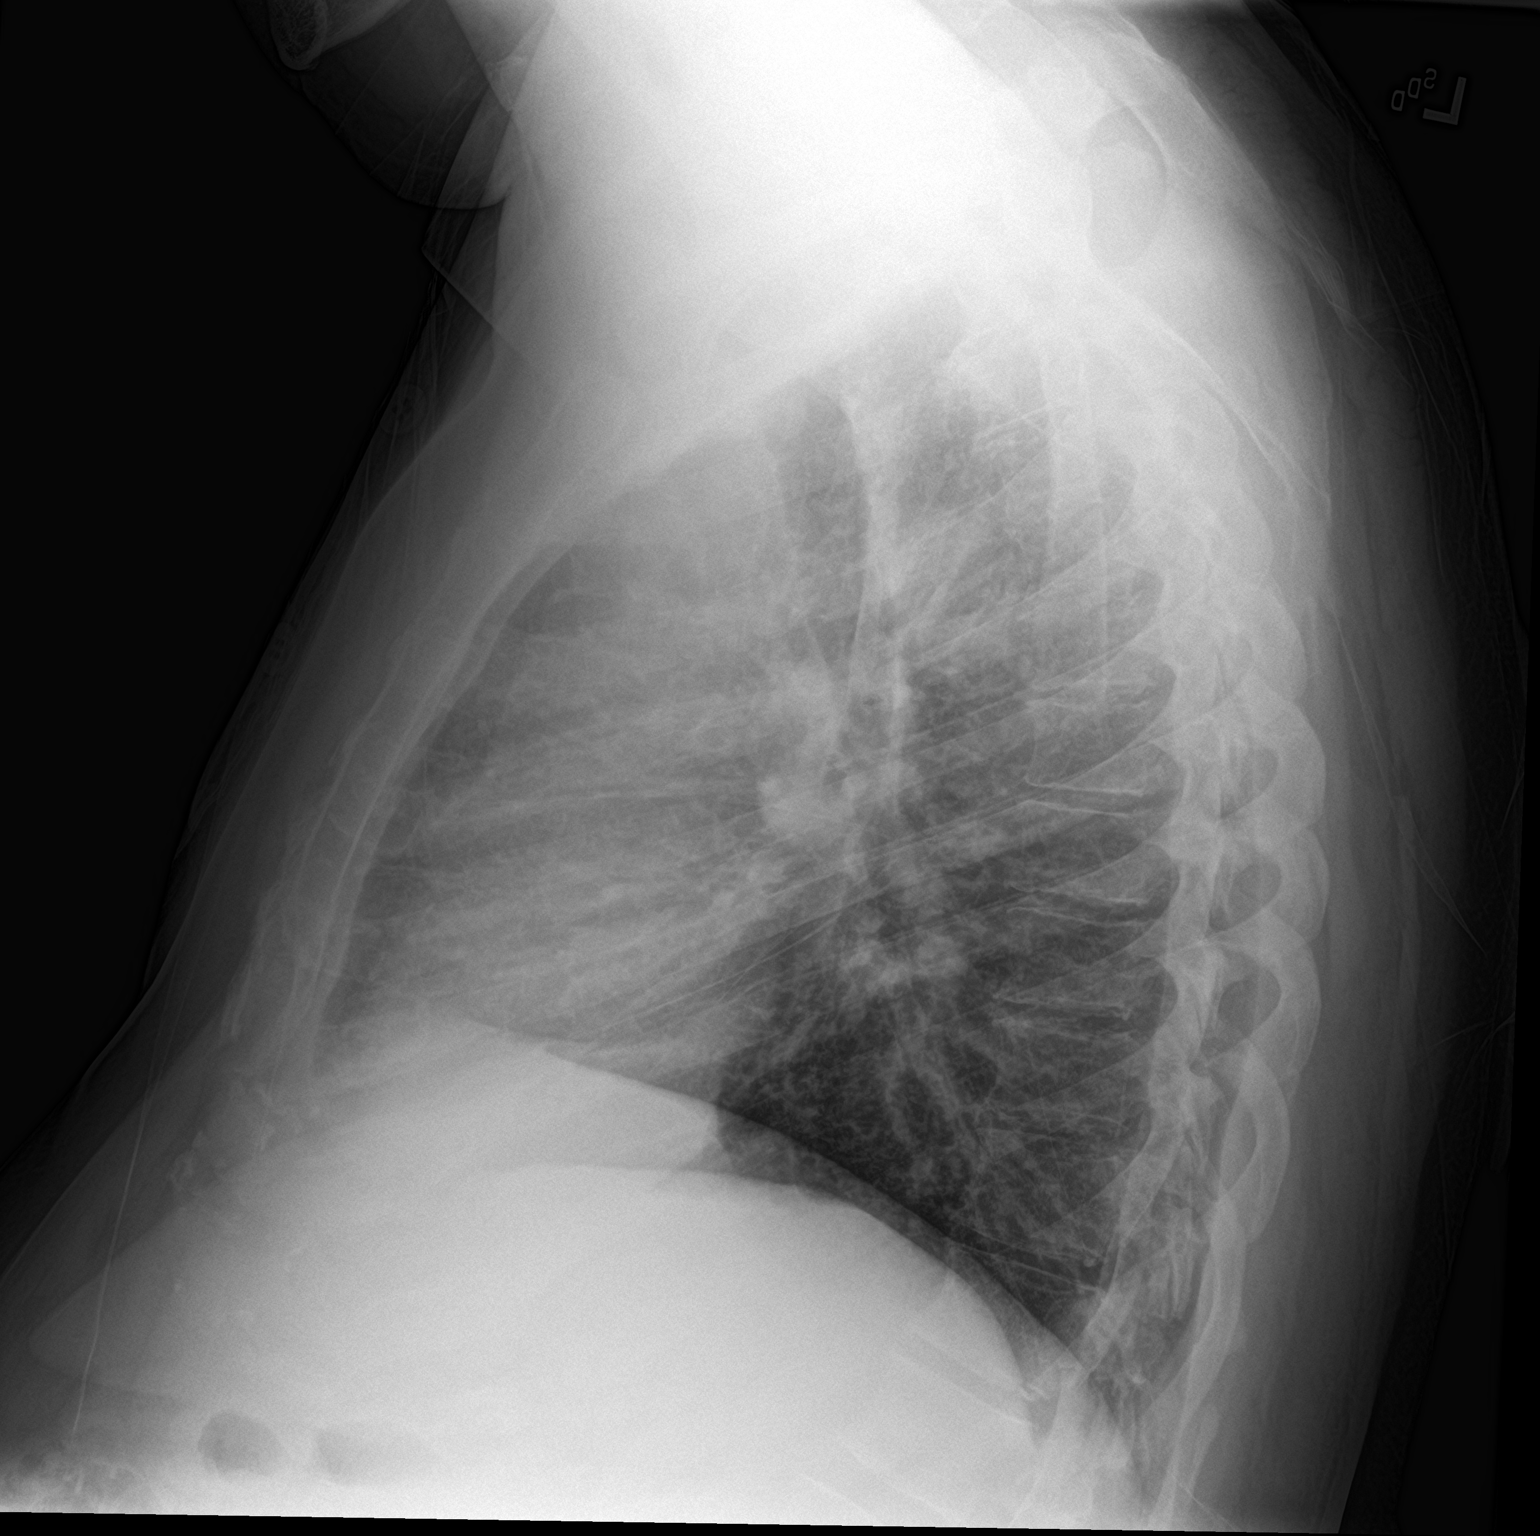

[2 of 2 positions shown; findings below may reference images not displayed]

FINDINGS: Lung volumes are low. No consolidative airspace disease. No pleural
effusions. No pneumothorax. No pulmonary nodule or mass noted.
Pulmonary vasculature and the cardiomediastinal silhouette are
within normal limits.
IMPRESSION: 1. Low lung volumes without radiographic evidence of acute
cardiopulmonary disease.

## 2022-04-16 ENCOUNTER — Emergency Department: Payer: BC Managed Care – PPO

## 2022-04-16 ENCOUNTER — Observation Stay
Admit: 2022-04-16 | Discharge: 2022-04-16 | Disposition: A | Discharge status: Home / Self Care | Payer: BC Managed Care – PPO | Attending: Internal Medicine | Admitting: Internal Medicine

## 2022-04-16 ENCOUNTER — Other Ambulatory Visit: Payer: Self-pay

## 2022-04-16 ENCOUNTER — Encounter: Payer: Self-pay | Admitting: Internal Medicine

## 2022-04-16 ENCOUNTER — Observation Stay
Admission: EM | Admit: 2022-04-16 | Discharge: 2022-04-16 | Disposition: A | Discharge status: Home / Self Care | Payer: BC Managed Care – PPO | Attending: Obstetrics and Gynecology | Admitting: Obstetrics and Gynecology

## 2022-04-16 DIAGNOSIS — Z79899 Other long term (current) drug therapy: Secondary | ICD-10-CM | POA: Insufficient documentation

## 2022-04-16 DIAGNOSIS — J45909 Unspecified asthma, uncomplicated: Secondary | ICD-10-CM | POA: Diagnosis not present

## 2022-04-16 DIAGNOSIS — I4891 Unspecified atrial fibrillation: Principal | ICD-10-CM | POA: Insufficient documentation

## 2022-04-16 DIAGNOSIS — I872 Venous insufficiency (chronic) (peripheral): Secondary | ICD-10-CM | POA: Diagnosis not present

## 2022-04-16 DIAGNOSIS — I10 Essential (primary) hypertension: Secondary | ICD-10-CM | POA: Insufficient documentation

## 2022-04-16 DIAGNOSIS — Z6841 Body Mass Index (BMI) 40.0 and over, adult: Secondary | ICD-10-CM | POA: Diagnosis not present

## 2022-04-16 DIAGNOSIS — E1165 Type 2 diabetes mellitus with hyperglycemia: Secondary | ICD-10-CM | POA: Diagnosis not present

## 2022-04-16 DIAGNOSIS — R079 Chest pain, unspecified: Secondary | ICD-10-CM

## 2022-04-16 DIAGNOSIS — Z85048 Personal history of other malignant neoplasm of rectum, rectosigmoid junction, and anus: Secondary | ICD-10-CM | POA: Insufficient documentation

## 2022-04-16 DIAGNOSIS — R002 Palpitations: Secondary | ICD-10-CM | POA: Diagnosis present

## 2022-04-16 DIAGNOSIS — R0602 Shortness of breath: Secondary | ICD-10-CM | POA: Insufficient documentation

## 2022-04-16 DIAGNOSIS — G4730 Sleep apnea, unspecified: Secondary | ICD-10-CM

## 2022-04-16 LAB — CBC WITH DIFFERENTIAL/PLATELET
Abs Immature Granulocytes: 0.04 10*3/uL (ref 0.00–0.07)
Basophils Absolute: 0.1 10*3/uL (ref 0.0–0.1)
Basophils Relative: 1 %
Eosinophils Absolute: 0.8 10*3/uL — ABNORMAL HIGH (ref 0.0–0.5)
Eosinophils Relative: 9 %
HCT: 47.5 % (ref 39.0–52.0)
Hemoglobin: 15.1 g/dL (ref 13.0–17.0)
Immature Granulocytes: 0 %
Lymphocytes Relative: 20 %
Lymphs Abs: 1.9 10*3/uL (ref 0.7–4.0)
MCH: 28 pg (ref 26.0–34.0)
MCHC: 31.8 g/dL (ref 30.0–36.0)
MCV: 88.1 fL (ref 80.0–100.0)
Monocytes Absolute: 0.6 10*3/uL (ref 0.1–1.0)
Monocytes Relative: 7 %
Neutro Abs: 5.9 10*3/uL (ref 1.7–7.7)
Neutrophils Relative %: 63 %
Platelets: 209 10*3/uL (ref 150–400)
RBC: 5.39 MIL/uL (ref 4.22–5.81)
RDW: 13 % (ref 11.5–15.5)
WBC: 9.3 10*3/uL (ref 4.0–10.5)
nRBC: 0 % (ref 0.0–0.2)

## 2022-04-16 LAB — TSH: TSH: 2.593 u[IU]/mL (ref 0.350–4.500)

## 2022-04-16 LAB — COMPREHENSIVE METABOLIC PANEL
ALT: 23 U/L (ref 0–44)
AST: 23 U/L (ref 15–41)
Albumin: 3.8 g/dL (ref 3.5–5.0)
Alkaline Phosphatase: 59 U/L (ref 38–126)
Anion gap: 8 (ref 5–15)
BUN: 21 mg/dL — ABNORMAL HIGH (ref 6–20)
CO2: 26 mmol/L (ref 22–32)
Calcium: 8.7 mg/dL — ABNORMAL LOW (ref 8.9–10.3)
Chloride: 104 mmol/L (ref 98–111)
Creatinine, Ser: 0.92 mg/dL (ref 0.61–1.24)
GFR, Estimated: >60 mL/min (ref >60)
Glucose, Bld: 203 mg/dL — ABNORMAL HIGH (ref 70–99)
Potassium: 3.6 mmol/L (ref 3.5–5.1)
Sodium: 138 mmol/L (ref 135–145)
Total Bilirubin: 0.7 mg/dL (ref 0.3–1.2)
Total Protein: 6.7 g/dL (ref 6.5–8.1)

## 2022-04-16 LAB — ECHOCARDIOGRAM COMPLETE
AR max vel: 3.2 cm2
AV Peak grad: 3.5 mmHg
Ao pk vel: 0.94 m/s
Area-P 1/2: 4.31 cm2
Calc EF: 68.4 %
Height: 73 in
S' Lateral: 3.36 cm
Single Plane A2C EF: 70.5 %
Single Plane A4C EF: 64 %
Weight: 4800 oz

## 2022-04-16 LAB — LIPASE, BLOOD: Lipase: 59 U/L — ABNORMAL HIGH (ref 11–51)

## 2022-04-16 LAB — TROPONIN I (HIGH SENSITIVITY)
Troponin I (High Sensitivity): 5 ng/L (ref <18)
Troponin I (High Sensitivity): 4 ng/L (ref <18)

## 2022-04-16 LAB — MAGNESIUM: Magnesium: 2.2 mg/dL (ref 1.7–2.4)

## 2022-04-16 LAB — T4, FREE: Free T4: 1.33 ng/dL — ABNORMAL HIGH (ref 0.61–1.12)

## 2022-04-16 LAB — CBG MONITORING, ED
Glucose-Capillary: 146 mg/dL — ABNORMAL HIGH (ref 70–99)
Glucose-Capillary: 119 mg/dL — ABNORMAL HIGH (ref 70–99)

## 2022-04-16 LAB — BRAIN NATRIURETIC PEPTIDE: B Natriuretic Peptide: 29.4 pg/mL (ref 0.0–100.0)

## 2022-04-16 MED ORDER — APIXABAN 5 MG PO TABS: 5.0000 mg | ORAL_TABLET | Freq: Two times a day (BID) | ORAL | 1 refills | Status: DC

## 2022-04-16 MED ORDER — ACETAMINOPHEN 650 MG RE SUPP: 650.0000 mg | Freq: Four times a day (QID) | RECTAL | Status: DC | PRN

## 2022-04-16 MED ORDER — APIXABAN 5 MG PO TABS
5.0000 mg | ORAL_TABLET | Freq: Two times a day (BID) | ORAL | Status: DC
  Administered 2022-04-16: 5 mg via ORAL
  Filled 2022-04-16: qty 1

## 2022-04-16 MED ORDER — ACETAMINOPHEN 325 MG PO TABS: 650.0000 mg | ORAL_TABLET | Freq: Four times a day (QID) | ORAL | Status: DC | PRN

## 2022-04-16 MED ORDER — INSULIN ASPART 100 UNIT/ML IJ SOLN: 0.0000 [IU] | Freq: Three times a day (TID) | INTRAMUSCULAR | Status: DC

## 2022-04-16 MED ORDER — METOPROLOL TARTRATE 25 MG PO TABS
25.0000 mg | ORAL_TABLET | Freq: Two times a day (BID) | ORAL | Status: DC
Start: 2022-04-16 — End: 2022-04-16
  Administered 2022-04-16: 25 mg via ORAL
  Filled 2022-04-16: qty 1

## 2022-04-16 MED ORDER — DAPAGLIFLOZIN PROPANEDIOL 5 MG PO TABS
5.0000 mg | ORAL_TABLET | Freq: Every day | ORAL | Status: DC
  Administered 2022-04-16: 5 mg via ORAL
  Filled 2022-04-16 (×2): qty 1

## 2022-04-16 MED ORDER — ONDANSETRON HCL 4 MG/2ML IJ SOLN: 4.0000 mg | Freq: Four times a day (QID) | INTRAMUSCULAR | Status: DC | PRN

## 2022-04-16 MED ORDER — PERFLUTREN LIPID MICROSPHERE
1.0000 mL | INTRAVENOUS | Status: AC | PRN
  Administered 2022-04-16: 3 mL via INTRAVENOUS

## 2022-04-16 MED ORDER — CALCIUM GLUCONATE-NACL 1-0.675 GM/50ML-% IV SOLN
1.0000 g | Freq: Once | INTRAVENOUS | Status: AC
  Administered 2022-04-16: 1000 mg via INTRAVENOUS
  Filled 2022-04-16: qty 50

## 2022-04-16 MED ORDER — DILTIAZEM HCL-DEXTROSE 125-5 MG/125ML-% IV SOLN (PREMIX)
5.0000 mg/h | INTRAVENOUS | Status: DC
  Filled 2022-04-16: qty 125

## 2022-04-16 MED ORDER — ONDANSETRON HCL 4 MG PO TABS: 4.0000 mg | ORAL_TABLET | Freq: Four times a day (QID) | ORAL | Status: DC | PRN

## 2022-04-16 MED ORDER — POTASSIUM CHLORIDE CRYS ER 20 MEQ PO TBCR
40.0000 meq | EXTENDED_RELEASE_TABLET | Freq: Once | ORAL | Status: AC
  Administered 2022-04-16: 40 meq via ORAL
  Filled 2022-04-16: qty 2

## 2022-04-16 MED ORDER — SODIUM CHLORIDE 0.9 % IV BOLUS
1000.0000 mL | Freq: Once | INTRAVENOUS | Status: AC
  Administered 2022-04-16: 1000 mL via INTRAVENOUS

## 2022-04-16 MED ORDER — IPRATROPIUM BROMIDE 0.02 % IN SOLN
0.5000 mg | Freq: Four times a day (QID) | RESPIRATORY_TRACT | Status: DC | PRN
  Administered 2022-04-16: 0.5 mg via RESPIRATORY_TRACT
  Filled 2022-04-16: qty 2.5

## 2022-04-16 MED ORDER — METOPROLOL TARTRATE 25 MG PO TABS: 25.0000 mg | ORAL_TABLET | Freq: Two times a day (BID) | ORAL | 1 refills | Status: DC

## 2022-04-16 MED ORDER — DILTIAZEM HCL 25 MG/5ML IV SOLN
10.0000 mg | Freq: Once | INTRAVENOUS | Status: AC
  Administered 2022-04-16: 10 mg via INTRAVENOUS
  Filled 2022-04-16: qty 5

## 2022-04-16 MED ORDER — ALBUTEROL SULFATE HFA 108 (90 BASE) MCG/ACT IN AERS: 2.0000 | INHALATION_SPRAY | RESPIRATORY_TRACT | Status: DC | PRN

## 2022-04-16 MED ORDER — INSULIN ASPART 100 UNIT/ML IJ SOLN: 0.0000 [IU] | Freq: Every day | INTRAMUSCULAR | Status: DC

## 2022-04-16 NOTE — Assessment & Plan Note (Addendum)
Blood sugar 203 Continue Farxiga Sliding scale insulin coverage

## 2022-04-16 NOTE — Assessment & Plan Note (Signed)
BMI 39.58 coexisting with several comorbidities complicating overall prognosis and care

## 2022-04-16 NOTE — Assessment & Plan Note (Signed)
Calcium 8.7.  Received calcium gluconate in the ED

## 2022-04-16 NOTE — ED Notes (Signed)
Echo at pts bedside

## 2022-04-16 NOTE — Assessment & Plan Note (Signed)
Keep legs elevated

## 2022-04-16 NOTE — Progress Notes (Signed)
Interim progress note, not for billing  24mhx obesity, tobacco abuse, osa not on cpap, dm, here with chest pain/palpitations radiating to neck. Found to be in a fib with rvr, responded well to dose of diltiazem though now hypotensive. HR now controlled. Patient reports years of similar symptoms that resolve spontaneously so suspect this to be a chronic issue. Tsh wnl. Troponins neg, bnp wnl, cxr clear. Benign physical exam this morning. TTE is ordered and pending. Apixiban has been started for chads2vasc score of 2. Cardiology has been consulted, will discuss case with them. Will need to treat OSA henceforth.

## 2022-04-16 NOTE — Discharge Summary (Signed)
Patrick Burns FKC:[id number] DOB: 1961-06-12 DOA: 04/16/2022  PCP: Jodi Marble, MD  Admit date: 04/16/2022 Discharge date: 04/16/2022  Time spent: 35 minutes  Recommendations for Outpatient Follow-up:  Cardiology and pcp f/u     Discharge Diagnoses:  Principal Problem:   New onset atrial fibrillation with RVR(HCC) Active Problems:   Uncontrolled type 2 diabetes mellitus with hyperglycemia, without long-term current use of insulin ([place])   Essential hypertension   Chronic venous insufficiency   Hypocalcemia   Sleep apnea   Obesity, Class III, BMI 40-49.9 (morbid obesity) ([place])   Discharge Condition: stable  Diet recommendation: heart healthy  Filed Weights   04/16/22 0144  Weight: 136.1 kg    History of present illness:  From admission h and p   Hospital Course:  Patient presents with palpitations and pain radiating to jaw. Found to be in a fib with rvr. New diagnosis but has years of intermittent similar symptoms so likely this is chronic paroxysmal a fib. No signs of ischemia on ekg and serial troponins. Treated with IV diltiazem which resolved a fib but did result in hypotension that slowly resolved. TTE obtained, no significant abnormalities on initial read by cardiology, final read pending. Seen and evaluated by cardiology. Started on metoprolol and apixaban (chads2vasc 2), will discharge with close outpatient cardiology f/u. Will hold home antihypertensives pending f/u BP check. Discussed importance of diet, weight loss, exercise, and treatment of underlying OSA.  Procedures: none   Consultations: cardiology  Discharge Exam: Vitals:   04/16/22 1000 04/16/22 1040  BP: 93/66 102/62  Pulse: 67 65  Resp: (!) 21 19  Temp:  98.4 F (36.9 C)  SpO2: 92% 94%    General: NAD Cardiovascular: RRR Respiratory: CTAB  Discharge Instructions   Discharge Instructions     Amb referral to [facility]   Complete by: As directed    Diet - low sodium heart  healthy   Complete by: As directed    Increase activity slowly   Complete by: As directed       Allergies as of 04/16/2022       Reactions   Bee Venom Anaphylaxis   Ibuprofen Anaphylaxis   Biaxin [clarithromycin] Nausea And Vomiting   Mango Flavor Swelling   LIPS   Metformin And Related Diarrhea        Medication List     STOP taking these medications    amLODipine-benazepril 5-20 MG capsule Commonly known as: LOTREL   EXCEDRIN MIGRAINE PO       TAKE these medications    apixaban 5 MG Tabs tablet Commonly known as: ELIQUIS Take 1 tablet (5 mg total) by mouth 2 (two) times daily.   benzonatate 100 MG capsule Commonly known as: Tessalon Perles 1-2 every 8 hours prn cough   cetirizine 10 MG tablet Commonly known as: ZYRTEC Take 10 mg by mouth.   CHOLEST OFF PO Take 2 tablets by mouth daily.   Co Q-10 200 MG Caps Take 1 capsule by mouth daily.   EPIPEN IJ Inject 1 Dose as directed as needed.   famotidine 20 MG tablet Commonly known as: PEPCID Take 20 mg by mouth 2 (two) times daily as needed.   Farxiga 5 MG Tabs tablet Generic drug: dapagliflozin propanediol Take 5 mg by mouth daily.   fluticasone 50 MCG/ACT nasal spray Commonly known as: FLONASE 1 SPRAY PER NARES PRN   glucosamine-chondroitin 500-400 MG tablet Take 1 tablet by mouth 2 (two) times daily.   KRILL OIL  PO Take 1 capsule by mouth daily.   metoprolol tartrate 25 MG tablet Commonly known as: LOPRESSOR Take 1 tablet (25 mg total) by mouth 2 (two) times daily.   Multi-Vitamins Tabs Take 1 tablet by mouth daily.   ondansetron 4 MG disintegrating tablet Commonly known as: Zofran ODT Take 1 tablet (4 mg total) by mouth every 8 (eight) hours as needed for nausea or vomiting.   polyethylene glycol 17 g packet Commonly known as: MiraLax Take twice a day until BM are soft, then once a day as needed for constipation.   Quinoa Kale & Hemp Liqd Take 1 drop by mouth daily.   Qvar  RediHaler 80 MCG/ACT inhaler Generic drug: beclomethasone Inhale 1 puff into the lungs daily.   rosuvastatin 10 MG tablet Commonly known as: CRESTOR Take 10 mg by mouth at bedtime.   Rybelsus 7 MG Tabs Generic drug: Semaglutide Take 1 tablet by mouth at bedtime.   sildenafil 100 MG tablet Commonly known as: VIAGRA Take 100 mg by mouth as needed.   sildenafil 20 MG tablet Commonly known as: REVATIO Take 20 mg by mouth daily as needed.   Testosterone 1.62 % Gel Apply 1 application topically daily.   Ventolin HFA 108 (90 Base) MCG/ACT inhaler Generic drug: albuterol Inhale 2 puffs into the lungs every 4 (four) hours as needed.   vitamin E 180 MG (400 UNITS) capsule Generic drug: vitamin E Take 400 Units by mouth daily.       Allergies  Allergen Reactions   Bee Venom Anaphylaxis   Ibuprofen Anaphylaxis   Biaxin [Clarithromycin] Nausea And Vomiting   Mango Flavor Swelling    LIPS   Metformin And Related Diarrhea    Follow-up Information     Jodi Marble, MD Follow up.   Specialty: Internal Medicine Contact information: [place] [address] [phone]         Dionisio David, MD Follow up.   Specialty: Cardiology Contact information: [place] [place] [address] [phone]                  The results of significant diagnostics from this hospitalization (including imaging, microbiology, ancillary and laboratory) are listed below for reference.    Significant Diagnostic Studies: DG Chest Port 1 View  Result Date: 04/16/2022 CLINICAL DATA:  Chest pain EXAM: PORTABLE CHEST 1 VIEW COMPARISON:  03/01/2021 FINDINGS: The heart size and mediastinal contours are within normal limits. Both lungs are clear. The visualized skeletal structures are unremarkable. IMPRESSION: No active disease. Electronically Signed   By: Fidela Salisbury M.D.   On: 04/16/2022 02:18    Microbiology: No results found for this or any previous  visit (from the past 240 hour(s)).   Labs: Basic Metabolic Panel: Recent Labs  Lab 04/16/22 0158 04/16/22 0427  NA 138  --   K 3.6  --   CL 104  --   CO2 26  --   GLUCOSE 203*  --   BUN 21*  --   CREATININE 0.92  --   CALCIUM 8.7*  --   MG  --  2.2   Liver Function Tests: Recent Labs  Lab 04/16/22 0158  AST 23  ALT 23  ALKPHOS 59  BILITOT 0.7  PROT 6.7  ALBUMIN 3.8   Recent Labs  Lab 04/16/22 0158  LIPASE 59*   No results for input(s): "AMMONIA" in the last 168 hours. CBC: Recent Labs  Lab 04/16/22 0158  WBC 9.3  NEUTROABS 5.9  HGB 15.1  HCT 47.5  MCV 88.1  PLT 209   Cardiac Enzymes: No results for input(s): "CKTOTAL", "CKMB", "CKMBINDEX", "TROPONINI" in the last 168 hours. BNP: BNP (last 3 results) Recent Labs    04/16/22 0158  BNP 29.4    ProBNP (last 3 results) No results for input(s): "PROBNP" in the last 8760 hours.  CBG: Recent Labs  Lab 04/16/22 0758 04/16/22 1153  GLUCAP 146* 119*       Signed:  Desma Maxim MD.  [facility] 04/16/2022, 1:10 PM

## 2022-04-16 NOTE — Consult Note (Signed)
[facility] Cardiology  CARDIOLOGY CONSULT NOTE  Patient ID: Patrick Burns MRN: [id number] DOB/AGE: 1961-08-08 61 y.o.  Admit date: 04/16/2022 Referring Physician [facility] Primary Physician [facility] Primary Cardiologist Humphrey Rolls Reason for Consultation atrial fibrillation  HPI: 61-old gentleman referred for evaluation new onset atrial fibrillation.  The patient was in his usual state of health until/20/2023 he experienced "heart fluttering" while he was eating dinner on 04/15/2022.  Patient had a similar episode approximately 2 weeks ago.  He presented to [facility] emergency room where ECG revealed atrial fibrillation at a rate of 127 bpm.  The patient received diltiazem 10 mg IV bolus and converted to sinus rhythm at a rate of 87 bpm.  Patient has ruled out for myocardial infarction with normal high-sensitivity troponin (5, 4).  The patient has a history of essential hypertension (on amlodipine-benazepril ) and type 2 diabetes (on Rybelsus and [place]) with a CHA2DS2-VASc score of 2.  The patient was started on Eliquis for stroke prevention.  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/ [facility]/squamous cell carcinoma anal rectum area   Peripheral edema    Sleep apnea    NOT USING CPAP CURRENTLY   Umbilical hernia     Past Surgical History:  Procedure Laterality Date   COLONOSCOPY  2011   [facility]   KNEE SURGERY     TONSILLECTOMY     UMBILICAL HERNIA REPAIR N/A 05/06/2015   Procedure: HERNIA REPAIR UMBILICAL ADULT;  Surgeon: Christene Lye, MD;  Location: [facility] ORS;  Service: General;  Laterality:  N/A;    (Not in a hospital admission)  Social History   Socioeconomic History   Marital status: Married    Spouse name: Not on file   Number of children: Not on file   Years of education: Not on file   Highest education level: Not on file  Occupational History   Not on file  Tobacco Use   Smoking status: Never   Smokeless tobacco: Current    Types: Snuff   Tobacco comments:    smokelss x 44 years (1 can every other day)  Substance and Sexual Activity   Alcohol use: Not Currently    Alcohol/week: 0.0 standard drinks of alcohol    Comment: OCC SIP [place]   Drug use: No   Sexual activity: Not on file  Other Topics Concern   Not on file  Social History Narrative   Not on file   Social Determinants of Health   Financial Resource Strain: Not on file  Food Insecurity: Not on file  Transportation Needs: Not on file  Physical Activity: Not on file  Stress: Not on file  Social Connections: Not on file  Intimate Partner Violence: Not on file    Family History  Problem Relation Age of Onset   Diabetes Father    Diabetes Brother       Review of systems complete and found to be negative unless listed above      PHYSICAL EXAM  General: Well  developed, well nourished, in no acute distress HEENT:  Normocephalic and atramatic Neck:  No JVD.  Lungs: Clear bilaterally to auscultation and percussion. Heart: HRRR . Normal S1 and S2 without gallops or murmurs.  Abdomen: Bowel sounds are positive, abdomen soft and non-tender  Msk:  Back normal, normal gait. Normal strength and tone for age. Extremities: No clubbing, cyanosis or edema.   Neuro: Alert and oriented X 3. Psych:  Good affect, responds appropriately  Labs:   Lab Results  Component Value Date   WBC 9.3 04/16/2022   HGB 15.1 04/16/2022   HCT 47.5 04/16/2022   MCV 88.1 04/16/2022   PLT 209 04/16/2022    Recent Labs  Lab 04/16/22 0158  NA 138  K 3.6  CL 104  CO2 26  BUN 21*  CREATININE 0.92   CALCIUM 8.7*  PROT 6.7  BILITOT 0.7  ALKPHOS 59  ALT 23  AST 23  GLUCOSE 203*   Lab Results  Component Value Date   CKTOTAL 46 03/23/2012   CKMB 0.9 03/23/2012   TROPONINI <0.03 10/20/2018   No results found for: "CHOL" No results found for: "HDL" No results found for: "LDLCALC" No results found for: "TRIG" No results found for: "CHOLHDL" No results found for: "LDLDIRECT"    Radiology: [facility] Chest Port 1 View  Result Date: 04/16/2022 CLINICAL DATA:  Chest pain EXAM: PORTABLE CHEST 1 VIEW COMPARISON:  03/01/2021 FINDINGS: The heart size and mediastinal contours are within normal limits. Both lungs are clear. The visualized skeletal structures are unremarkable. IMPRESSION: No active disease. Electronically Signed   By: Fidela Salisbury M.D.   On: 04/16/2022 02:18    EKG: Atrial fibrillation at 127 bpm  ASSESSMENT AND PLAN:   1.  New onset atrial fibrillation with rapid ventricular rate, converted to sinus rhythm after diltiazem bolus, CHA2DS2-VASc score of 2, started on Eliquis for stroke prevention 2.  Essential hypertension 3.  Type 2 diabetes  Recommendations  1.  Agree with current therapy 2.  Continue Eliquis for stroke prevention 3.  Low-dose metoprolol to tartrate 25 mg twice daily 4.  Review 2D echocardiogram, pending results, likely can be discharged home later today, follow-up with Dr. Humphrey Rolls in 1 to 2 weeks  Signed: Isaias Cowman MD,PhD, [facility] 04/16/2022, 9:37 AM

## 2022-04-16 NOTE — Progress Notes (Signed)
*  PRELIMINARY RESULTS* Echocardiogram 2D Echocardiogram has been performed. Definity IV ultrasound imaging agent used on this study.  Patrick Burns 04/16/2022, 12:56 PM

## 2022-04-16 NOTE — Assessment & Plan Note (Signed)
CPAP nightly

## 2022-04-16 NOTE — Assessment & Plan Note (Signed)
Heart rate in the 140s on arrival, converting to sinus after administration of diltiazem 10 mg bolus CHADS2 Vascor of 2 so will benefit from systemic anticoagulation for stroke prevention We will start metoprolol as now in sinus Start apixaban We will get echocardiogram Cardiology consult

## 2022-04-16 NOTE — Assessment & Plan Note (Signed)
BP soft after diltiazem in the ED so we will hold home Lotrel

## 2022-04-16 NOTE — ED Triage Notes (Signed)
Ambulatory to triage with c/o " heart racing, my jaw hurts, and my mouth is watering." Similar episode happened last week and went away without intervention.  Sx began suddenly again tonight around 11pm.  Pt reports was eating dinner when it began.  Denies cardiac hx. Denies any pain in chest, states just feels like heart is fluttering.

## 2022-04-16 NOTE — ED Provider Notes (Signed)
[facility] Provider Note    Event Date/Time   First MD Initiated Contact with Patient 04/16/22 0153     (approximate)   History   Chest Pain   HPI  Patrick Burns is a 61 y.o. male who presents to the ED from home with a chief complaint of heart racing, jaw hurting, mouth watering.  Similar episode 2 weeks ago which resolved without intervention.  No prior history of atrial fibrillation.  Symptoms began around 11 PM.  Denies associated chest pain but endorses shortness of breath.  Denies diaphoresis, abdominal pain, nausea, vomiting or dizziness.  Does not drink excessive caffeine.  Denies illicit drug use.  Denies energy drinks.     Past Medical History   Past Medical History:  Diagnosis Date   Asthma    Blood dyscrasia    "BLOOD IS THICK"   Diabetes mellitus without complication (HCC)    Dyspnea on exertion    GERD (gastroesophageal reflux disease)    Headache    MIGRAINES   Hypertension    Hypogonadism male    Leaky heart valve    Metastatic cancer ([place]) 2006   Rectum --> prostate --> bladder/chemo and radiation/ [facility]/squamous cell carcinoma anal rectum area   Peripheral edema    Sleep apnea    NOT USING CPAP CURRENTLY   Umbilical hernia      Active Problem List   Patient Active Problem List   Diagnosis Date Noted   New onset atrial fibrillation ([place]) 04/16/2022   Status post repair of ventral hernia 06/30/2020   Chronic venous insufficiency 01/02/2018   Lymphedema 01/02/2018   Essential hypertension 12/03/2017   Bilateral lower extremity edema 12/03/2017   Lower extremity pain, bilateral 12/03/2017   Hyperlipidemia 12/03/2017   Diastasis recti 04/22/2015     Past Surgical History   Past Surgical History:  Procedure Laterality Date   COLONOSCOPY  2011   [facility]   KNEE SURGERY     TONSILLECTOMY     UMBILICAL HERNIA REPAIR N/A 05/06/2015   Procedure: HERNIA REPAIR UMBILICAL ADULT;  Surgeon: Christene Lye, MD;   Location: [facility] ORS;  Service: General;  Laterality: N/A;     Home Medications   Prior to Admission medications   Medication Sig Start Date End Date Taking? Authorizing Provider  albuterol (VENTOLIN HFA) 108 (90 BASE) MCG/ACT inhaler Inhale 2 puffs into the lungs every 4 (four) hours as needed.  12/21/11   [provider]  amLODipine-benazepril (LOTREL) 5-20 MG capsule Take 1 capsule by mouth daily.     [provider]  Aspirin-Acetaminophen-Caffeine (EXCEDRIN MIGRAINE PO) Take 2 tablets by mouth as needed.     [provider]  benzonatate (TESSALON PERLES) 100 MG capsule 1-2 every 8 hours prn cough 03/01/21   Letitia Neri L, PA-C  cetirizine (ZYRTEC) 10 MG tablet Take 10 mg by mouth.    [provider]  Coenzyme Q10 (CO Q-10) 200 MG CAPS Take 1 capsule by mouth daily.    [provider]  EPINEPHrine (EPIPEN IJ) Inject 1 Dose as directed as needed.    [provider]  famotidine (PEPCID) 20 MG tablet Take 20 mg by mouth 2 (two) times daily as needed.  04/04/19   [provider]  FARXIGA 5 MG TABS tablet Take 5 mg by mouth daily. 04/09/19   [provider]  fluticasone (FLONASE) 50 MCG/ACT nasal spray 1 SPRAY PER NARES PRN 03/04/15   [provider]  glucosamine-chondroitin 500-400 MG tablet Take 1 tablet by mouth 2 (two) times daily.    [provider]  KRILL OIL PO Take 1 capsule by mouth daily.    [provider]  Multiple Vitamin (MULTI-VITAMINS) TABS Take 1 tablet by mouth daily.     [provider]  Nutritional Supplements (QUINOA KALE & HEMP) LIQD Take 1 drop by mouth daily.    [provider]  ondansetron (ZOFRAN ODT) 4 MG disintegrating tablet Take 1 tablet (4 mg total) by mouth every 8 (eight) hours as needed for nausea or vomiting. 08/10/20   Duffy Bruce, MD  Plant Sterols and Stanols (CHOLEST OFF PO) Take 2 tablets by mouth daily.    [provider]   polyethylene glycol (MIRALAX) 17 g packet Take twice a day until BM are soft, then once a day as needed for constipation. 08/10/20   Duffy Bruce, MD  QVAR REDIHALER 80 MCG/ACT inhaler Inhale 1 puff into the lungs daily. 04/04/19   [provider]  rosuvastatin (CRESTOR) 10 MG tablet Take 10 mg by mouth at bedtime. 06/28/20   [provider]  RYBELSUS 7 MG TABS Take 1 tablet by mouth at bedtime. 05/31/20   [provider]  sildenafil (REVATIO) 20 MG tablet Take 20 mg by mouth daily as needed. 03/02/20   [provider]  sildenafil (VIAGRA) 100 MG tablet Take 100 mg by mouth as needed.  08/07/14   [provider]  Testosterone 1.62 % GEL Apply 1 application topically daily.  02/04/19   [provider]  vitamin E (VITAMIN E) 400 UNIT capsule Take 400 Units by mouth daily.    [provider]     Allergies  Bee venom, Ibuprofen, Biaxin [clarithromycin], Mango flavor, and Metformin and related   Family History   Family History  Problem Relation Age of Onset   Diabetes Father    Diabetes Brother      Physical Exam  Triage Vital Signs: ED Triage Vitals  Enc Vitals Group     BP 04/16/22 0148 (!) 79/57     Pulse Rate 04/16/22 0148 (!) 146     Resp 04/16/22 0148 (!) 23     Temp 04/16/22 0148 98 F (36.7 C)     Temp Source 04/16/22 0148 Oral     SpO2 04/16/22 0148 (!) 89 %     Weight 04/16/22 0144 300 lb (136.1 kg)     Height 04/16/22 0144 '6\' 1"'$  (1.854 m)     Head Circumference --      Peak Flow --      Pain Score --      Pain Loc --      Pain Edu? --      Excl. in [place]? --     Updated Vital Signs: BP 93/64   Pulse 84   Temp 98 F (36.7 C) (Oral)   Resp (!) 22   Ht '6\' 1"'$  (1.854 m)   Wt 136.1 kg   SpO2 94%   BMI 39.58 kg/m    General: Awake, mild distress.  CV:  Irregularly irregular tachycardic rhythm.  Good peripheral perfusion.  Resp:  Increased effort.  Diminished aeration; otherwise CTA B. Abd:  Nontender.   No distention.  Other:  BLE lymphedema which is baseline per patient.   ED Results / Procedures / Treatments  Labs (all labs ordered are listed, but only abnormal results are displayed) Labs Reviewed  CBC WITH DIFFERENTIAL/PLATELET - Abnormal; Notable for the following  components:      Result Value   Eosinophils Absolute 0.8 (*)    All other components within normal limits  COMPREHENSIVE METABOLIC PANEL - Abnormal; Notable for the following components:   Glucose, Bld 203 (*)    BUN 21 (*)    Calcium 8.7 (*)    All other components within normal limits  LIPASE, BLOOD - Abnormal; Notable for the following components:   Lipase 59 (*)    All other components within normal limits  T4, FREE - Abnormal; Notable for the following components:   Free T4 1.33 (*)    All other components within normal limits  BRAIN NATRIURETIC PEPTIDE  TSH  TROPONIN I (HIGH SENSITIVITY)  TROPONIN I (HIGH SENSITIVITY)     EKG  ED ECG REPORT I, Lilliona Blakeney J, the attending physician, personally viewed and interpreted this ECG.   Date: 04/16/2022  EKG Time: 0150  Rate: 127  Rhythm: atrial fibrillation, rate 127  Axis: LAD  Intervals:none  ST&T Change: Nonspecific  ED ECG REPORT I, Belinda Bringhurst J, the attending physician, personally viewed and interpreted this ECG.   Date: 04/16/2022  EKG Time: 0219  Rate: 106  Rhythm: atrial fibrillation, rate 106  Axis: LAD  Intervals:none  ST&T Change: Nonspecific EKG is status post '10mg'$  IV diltiazem  ED ECG REPORT I, Brita Jurgensen J, the attending physician, personally viewed and interpreted this ECG.   Date: 04/16/2022  EKG Time: 0303  Rate: 87  Rhythm: normal sinus rhythm  Axis: LAD  Intervals:none  ST&T Change: Nonspecific   RADIOLOGY I have independently visualized and interpreted patient's chest x-ray as well as noted the radiology interpretation:  Chest x-ray: No acute cardiopulmonary process  Official radiology report(s): DG Chest Port 1  View  Result Date: 04/16/2022 CLINICAL DATA:  Chest pain EXAM: PORTABLE CHEST 1 VIEW COMPARISON:  03/01/2021 FINDINGS: The heart size and mediastinal contours are within normal limits. Both lungs are clear. The visualized skeletal structures are unremarkable. IMPRESSION: No active disease. Electronically Signed   By: Fidela Salisbury M.D.   On: 04/16/2022 02:18     PROCEDURES:  Critical Care performed: Yes, see critical care procedure note(s)  CRITICAL CARE Performed by: Paulette Blanch   Total critical care time: 30 minutes  Critical care time was exclusive of separately billable procedures and treating other patients.  Critical care was necessary to treat or prevent imminent or life-threatening deterioration.  Critical care was time spent personally by me on the following activities: development of treatment plan with patient and/or surrogate as well as nursing, discussions with consultants, evaluation of patient's response to treatment, examination of patient, obtaining history from patient or surrogate, ordering and performing treatments and interventions, ordering and review of laboratory studies, ordering and review of radiographic studies, pulse oximetry and re-evaluation of patient's condition.   Marland Kitchen1-3 Lead EKG Interpretation  Performed by: Paulette Blanch, MD Authorized by: Paulette Blanch, MD     Interpretation: abnormal     ECG rate:  130   ECG rate assessment: tachycardic     Rhythm: atrial fibrillation     Ectopy: none     Conduction: normal   Comments:     Patient placed on cardiac monitor to evaluate for arrhythmias    MEDICATIONS ORDERED IN ED: Medications  calcium gluconate 1 g/ 50 mL sodium chloride IVPB (1,000 mg Intravenous New Bag/Given 04/16/22 0303)  diltiazem (CARDIZEM) 125 mg in dextrose 5% 125 mL (1 mg/mL) infusion (0 mg/hr Intravenous Hold 04/16/22 0319)  sodium  chloride 0.9 % bolus 1,000 mL (1,000 mLs Intravenous New Bag/Given 04/16/22 0159)  diltiazem (CARDIZEM)  injection 10 mg (10 mg Intravenous Given 04/16/22 0204)     IMPRESSION / MDM / ASSESSMENT AND PLAN / ED COURSE  I reviewed the triage vital signs and the nursing notes.                             61 year old male presenting with palpitations and shortness of breath. Differential diagnosis includes, but is not limited to, ACS, aortic dissection, pulmonary embolism, cardiac tamponade, pneumothorax, pneumonia, pericarditis, myocarditis, GI-related causes including esophagitis/gastritis, and musculoskeletal chest wall pain.   I have personally reviewed patient's records and note a urology office visit from August 2022 for hypogonadism.  Patient's presentation is most consistent with acute presentation with potential threat to life or bodily function.  The patient is on the cardiac monitor to evaluate for evidence of arrhythmia and/or significant heart rate changes.  We will obtain cardiac panel, chest x-ray.  Repeat blood pressure 115/64.  Patient placed on 2 L nasal cannula oxygen with improvement in saturations to 94%.  Will initiate IV hydration, 10 mg IV diltiazem bolus and reassess.  Clinical Course as of 04/16/22 0342  Sun Apr 16, 2022  0220 Heart rate 106, atrial fibrillation.  We will continue to monitor. [JS]  0236 Heart rate 120s, will initiate diltiazem drip. [JS]  1194 Prior to initiation of Cardizem drip, patient converted to normal sinus rhythm at a rate of 87.  Have consulted hospitalist services for evaluation and admission of new onset atrial fibrillation. [JS]    Clinical Course User Index [JS] Paulette Blanch, MD     FINAL CLINICAL IMPRESSION(S) / ED DIAGNOSES   Final diagnoses:  Chest pain, unspecified type  New onset atrial fibrillation [place])     Rx / DC Orders   ED Discharge Orders     None        Note:  This document was prepared using Dragon voice recognition software and may include unintentional dictation errors.   Paulette Blanch, MD 04/16/22 [phone]

## 2022-04-16 NOTE — H&P (Signed)
History and Physical    Patient: Patrick Burns XBD:[id number] DOB: 1961-04-15 DOA: 04/16/2022 DOS: the patient was seen and examined on 04/16/2022 PCP: Jodi Marble, MD  Patient coming from: Home  Chief Complaint:  Chief Complaint  Patient presents with   Chest Pain    HPI: Patrick Burns is a 61 y.o. male with medical history significant for DM, HTN, obesity, sleep apnea who presents to the ED with an episode of palpitations described as "heart fluttering" associated with pain in the jaw starting suddenly at around 11 PM on 8/19 while he was having dinner.  The episode was associated with shortness of breath but denied chest pain.  Had a similar episode a couple weeks prior that resolved spontaneously.  He was in his usual state of health.  He denied lightheadedness. ED course and data review: Heart rate 146 with BP 79/57, respirations 23 and O2 sat 89% on room air.  Troponin 5, BNP 29, TSH normal at 29.  CMP and CBC significant only for glucose of 203 and mild hypocalcemia of 8.7.  Lipase 59 LFTs WNL EKG showed A-fib with rate of 127 and no acute ST-T wave changes.  Chest x-ray with no active disease. Patient received diltiazem 10 mg IV bolus and subsequently converted to sinus at 87 with resolution of his symptoms.  Observation requested.     Past Medical History:  Diagnosis Date   Asthma    Blood dyscrasia    "BLOOD IS THICK"   Diabetes mellitus without complication (HCC)    Dyspnea on exertion    GERD (gastroesophageal reflux disease)    Headache    MIGRAINES   Hypertension    Hypogonadism male    Leaky heart valve    Metastatic cancer ([place]) 2006   Rectum --> prostate --> bladder/chemo and radiation/ [facility]/squamous cell carcinoma anal rectum area   Peripheral edema    Sleep apnea    NOT USING CPAP CURRENTLY   Umbilical hernia    Past Surgical History:  Procedure Laterality Date   COLONOSCOPY  2011   [facility]   KNEE SURGERY     TONSILLECTOMY      UMBILICAL HERNIA REPAIR N/A 05/06/2015   Procedure: HERNIA REPAIR UMBILICAL ADULT;  Surgeon: Christene Lye, MD;  Location: [facility] ORS;  Service: General;  Laterality: N/A;   Social History:  reports that he has never smoked. His smokeless tobacco use includes snuff. He reports that he does not currently use alcohol. He reports that he does not use drugs.  Allergies  Allergen Reactions   Bee Venom Anaphylaxis   Ibuprofen Anaphylaxis   Biaxin [Clarithromycin] Nausea And Vomiting   Mango Flavor Swelling    LIPS   Metformin And Related Diarrhea    Family History  Problem Relation Age of Onset   Diabetes Father    Diabetes Brother     Prior to Admission medications   Medication Sig Start Date End Date Taking? Authorizing Provider  albuterol (VENTOLIN HFA) 108 (90 BASE) MCG/ACT inhaler Inhale 2 puffs into the lungs every 4 (four) hours as needed.  12/21/11   [provider]  amLODipine-benazepril (LOTREL) 5-20 MG capsule Take 1 capsule by mouth daily.     [provider]  Aspirin-Acetaminophen-Caffeine (EXCEDRIN MIGRAINE PO) Take 2 tablets by mouth as needed.     [provider]  benzonatate (TESSALON PERLES) 100 MG capsule 1-2 every 8 hours prn cough 03/01/21   Letitia Neri L, PA-C  cetirizine (ZYRTEC)  10 MG tablet Take 10 mg by mouth.    [provider]  Coenzyme Q10 (CO Q-10) 200 MG CAPS Take 1 capsule by mouth daily.    [provider]  EPINEPHrine (EPIPEN IJ) Inject 1 Dose as directed as needed.    [provider]  famotidine (PEPCID) 20 MG tablet Take 20 mg by mouth 2 (two) times daily as needed.  04/04/19   [provider]  FARXIGA 5 MG TABS tablet Take 5 mg by mouth daily. 04/09/19   [provider]  fluticasone (FLONASE) 50 MCG/ACT nasal spray 1 SPRAY PER NARES PRN 03/04/15   [provider]  glucosamine-chondroitin 500-400 MG tablet Take 1 tablet by mouth 2 (two) times daily.    [provider]  KRILL OIL PO Take 1 capsule by mouth daily.    [provider]  Multiple Vitamin (MULTI-VITAMINS) TABS Take 1 tablet by mouth daily.     [provider]  Nutritional Supplements (QUINOA KALE & HEMP) LIQD Take 1 drop by mouth daily.    [provider]  ondansetron (ZOFRAN ODT) 4 MG disintegrating tablet Take 1 tablet (4 mg total) by mouth every 8 (eight) hours as needed for nausea or vomiting. 08/10/20   Duffy Bruce, MD  Plant Sterols and Stanols (CHOLEST OFF PO) Take 2 tablets by mouth daily.    [provider]  polyethylene glycol (MIRALAX) 17 g packet Take twice a day until BM are soft, then once a day as needed for constipation. 08/10/20   Duffy Bruce, MD  QVAR REDIHALER 80 MCG/ACT inhaler Inhale 1 puff into the lungs daily. 04/04/19   [provider]  rosuvastatin (CRESTOR) 10 MG tablet Take 10 mg by mouth at bedtime. 06/28/20   [provider]  RYBELSUS 7 MG TABS Take 1 tablet by mouth at bedtime. 05/31/20   [provider]  sildenafil (REVATIO) 20 MG tablet Take 20 mg by mouth daily as needed. 03/02/20   [provider]  sildenafil (VIAGRA) 100 MG tablet Take 100 mg by mouth as needed.  08/07/14   [provider]  Testosterone 1.62 % GEL Apply 1 application topically daily.  02/04/19   [provider]  vitamin E (VITAMIN E) 400 UNIT capsule Take 400 Units by mouth daily.    [provider]    Physical Exam: Vitals:   04/16/22 0230 04/16/22 0300 04/16/22 0318 04/16/22 0330  BP: 98/65 93/64  99/73  Pulse: (!) 114 88 84 82  Resp: (!) 21 (!) 22 (!) 22 (!) 24  Temp:      TempSrc:      SpO2: 95% 94% 94% 96%  Weight:      Height:       Physical Exam Vitals and nursing note reviewed.  Constitutional:      General: He is not in acute distress. HENT:     Head: Normocephalic and atraumatic.  Cardiovascular:     Rate and Rhythm: Normal rate and regular rhythm.     Heart sounds: Normal  heart sounds.  Pulmonary:     Effort: Pulmonary effort is normal.     Breath sounds: Normal breath sounds.  Abdominal:     Palpations: Abdomen is soft.     Tenderness: There is no abdominal tenderness.  Neurological:     Mental Status: Mental status is at baseline.     Labs on Admission: I have personally reviewed following labs and imaging studies  CBC: Recent Labs  Lab  04/16/22 0158  WBC 9.3  NEUTROABS 5.9  HGB 15.1  HCT 47.5  MCV 88.1  PLT 409   Basic Metabolic Panel: Recent Labs  Lab 04/16/22 0158  NA 138  K 3.6  CL 104  CO2 26  GLUCOSE 203*  BUN 21*  CREATININE 0.92  CALCIUM 8.7*   GFR: Estimated Creatinine Clearance: 123.7 mL/min (by C-G formula based on SCr of 0.92 mg/dL). Liver Function Tests: Recent Labs  Lab 04/16/22 0158  AST 23  ALT 23  ALKPHOS 59  BILITOT 0.7  PROT 6.7  ALBUMIN 3.8   Recent Labs  Lab 04/16/22 0158  LIPASE 59*   No results for input(s): "AMMONIA" in the last 168 hours. Coagulation Profile: No results for input(s): "INR", "PROTIME" in the last 168 hours. Cardiac Enzymes: No results for input(s): "CKTOTAL", "CKMB", "CKMBINDEX", "TROPONINI" in the last 168 hours. BNP (last 3 results) No results for input(s): "PROBNP" in the last 8760 hours. HbA1C: No results for input(s): "HGBA1C" in the last 72 hours. CBG: No results for input(s): "GLUCAP" in the last 168 hours. Lipid Profile: No results for input(s): "CHOL", "HDL", "LDLCALC", "TRIG", "CHOLHDL", "LDLDIRECT" in the last 72 hours. Thyroid Function Tests: Recent Labs    04/16/22 0158  TSH 2.593  FREET4 1.33*   Anemia Panel: No results for input(s): "VITAMINB12", "FOLATE", "FERRITIN", "TIBC", "IRON", "RETICCTPCT" in the last 72 hours. Urine analysis:    Component Value Date/Time   COLORURINE STRAW (A) 08/10/2020 0700   APPEARANCEUR CLEAR (A) 08/10/2020 0700   APPEARANCEUR Clear 05/08/2014 0657   LABSPEC 1.006 08/10/2020 0700   LABSPEC 1.012 05/08/2014 0657    PHURINE 6.0 08/10/2020 0700   GLUCOSEU >=500 (A) 08/10/2020 0700   GLUCOSEU Negative 05/08/2014 0657   HGBUR NEGATIVE 08/10/2020 0700   BILIRUBINUR NEGATIVE 08/10/2020 0700   BILIRUBINUR Negative 05/08/2014 0657   KETONESUR NEGATIVE 08/10/2020 0700   PROTEINUR NEGATIVE 08/10/2020 0700   NITRITE NEGATIVE 08/10/2020 0700   LEUKOCYTESUR NEGATIVE 08/10/2020 0700   LEUKOCYTESUR Negative 05/08/2014 0657    Radiological Exams on Admission: DG Chest Port 1 View  Result Date: 04/16/2022 CLINICAL DATA:  Chest pain EXAM: PORTABLE CHEST 1 VIEW COMPARISON:  03/01/2021 FINDINGS: The heart size and mediastinal contours are within normal limits. Both lungs are clear. The visualized skeletal structures are unremarkable. IMPRESSION: No active disease. Electronically Signed   By: Fidela Salisbury M.D.   On: 04/16/2022 02:18     Data Reviewed: Relevant notes from primary care and specialist visits, past discharge summaries as available in EHR, including Care Everywhere. Prior diagnostic testing as pertinent to current admission diagnoses Updated medications and problem lists for reconciliation ED course, including vitals, labs, imaging, treatment and response to treatment Triage notes, nursing and pharmacy notes and ED provider's notes Notable results as noted in HPI   Assessment and Plan: * New onset atrial fibrillation with RVR(HCC) Heart rate in the 140s on arrival, converting to sinus after administration of diltiazem 10 mg bolus CHADS2 Vascor of 2 so will benefit from systemic anticoagulation for stroke prevention We will start metoprolol as now in sinus Start apixaban We will get echocardiogram Cardiology consult  Uncontrolled type 2 diabetes mellitus with hyperglycemia, without long-term current use of insulin (HCC) Blood sugar 203 Continue Farxiga Sliding scale insulin coverage  Obesity, Class III, BMI 40-49.9 (morbid obesity) (HCC) BMI 39.58 coexisting with several comorbidities  complicating overall prognosis and care  Sleep apnea CPAP nightly  Hypocalcemia Calcium 8.7.  Received calcium gluconate in the ED  Essential hypertension BP soft after diltiazem in the ED so we will hold home Lotrel        DVT prophylaxis: apixaban  Consults: Cardiology, [facility] Dr. Rockey Situ  Advance Care Planning:   Code Status: Full Code   Family Communication: none  Disposition Plan: Back to previous home environment  Severity of Illness: Observation  Author: Athena Masse, MD 04/16/2022 4:24 AM  For on call review [email or web address].

## 2022-04-16 NOTE — ED Notes (Signed)
Admitting paged through [facility] regarding patients low blood pressure. Patient is currently resting and no complaints of dizziness.

## 2022-04-20 ENCOUNTER — Ambulatory Visit (HOSPITAL_COMMUNITY)
Admission: RE | Admit: 2022-04-20 | Discharge: 2022-04-20 | Disposition: A | Discharge status: Home / Self Care | Payer: BC Managed Care – PPO | Source: Ambulatory Visit | Attending: Nurse Practitioner | Admitting: Nurse Practitioner

## 2022-04-20 ENCOUNTER — Encounter (HOSPITAL_COMMUNITY): Payer: Self-pay | Admitting: Nurse Practitioner

## 2022-04-20 VITALS — BP 94/66 | HR 56 | Ht 73.0 in | Wt 308.0 lb

## 2022-04-20 DIAGNOSIS — I10 Essential (primary) hypertension: Secondary | ICD-10-CM | POA: Diagnosis not present

## 2022-04-20 DIAGNOSIS — E119 Type 2 diabetes mellitus without complications: Secondary | ICD-10-CM | POA: Diagnosis not present

## 2022-04-20 DIAGNOSIS — G4733 Obstructive sleep apnea (adult) (pediatric): Secondary | ICD-10-CM | POA: Insufficient documentation

## 2022-04-20 DIAGNOSIS — D6869 Other thrombophilia: Secondary | ICD-10-CM

## 2022-04-20 DIAGNOSIS — I4891 Unspecified atrial fibrillation: Secondary | ICD-10-CM | POA: Diagnosis not present

## 2022-04-20 DIAGNOSIS — Z7901 Long term (current) use of anticoagulants: Secondary | ICD-10-CM | POA: Insufficient documentation

## 2022-04-20 NOTE — Progress Notes (Signed)
Primary Care Physician: Jodi Marble, MD Referring Physician: [facility] f/u  Cardiologist: Dr. Ned Clines is a 61 y.o. male with a h/o Prostate/bladder/rectum CA, LEE,  HTN, DM, that was seen in the ED at [facility] 8/20 for jaw pain, heart racing. He was found to be in afib at 106 bpm, new onset, but has similar symptoms in the past. He converted with 10 mg bolus of cardizem . He was admitted without ekg changes or positive troponin. He was started on metoprolol and eliquis 5 mg bid for a CHA2DS2VASc  score of 2. Home BP pills were held to get BB on board. He does wear CPAP for OSA. No alcohol. He had been dipping since age 22 and has weaned off but takes nicotine buccal absorbable strips for the last year. He is trying to wean off.   Today, he denies symptoms of palpitations, chest pain, shortness of breath, orthopnea, PND, lower extremity edema, dizziness, presyncope, syncope, or neurologic sequela. The patient is tolerating medications without difficulties and is otherwise without complaint today.   Past Medical History:  Diagnosis Date   Asthma    Blood dyscrasia    "BLOOD IS THICK"   Diabetes mellitus without complication (HCC)    Dyspnea on exertion    GERD (gastroesophageal reflux disease)    Headache    MIGRAINES   Hypertension    Hypogonadism male    Leaky heart valve    Metastatic cancer ([place]) 2006   Rectum --> prostate --> bladder/chemo and radiation/ [facility]/squamous cell carcinoma anal rectum area   Peripheral edema    Sleep apnea    NOT USING CPAP CURRENTLY   Umbilical hernia    Past Surgical History:  Procedure Laterality Date   COLONOSCOPY  2011   [facility]   KNEE SURGERY     TONSILLECTOMY     UMBILICAL HERNIA REPAIR N/A 05/06/2015   Procedure: HERNIA REPAIR UMBILICAL ADULT;  Surgeon: Christene Lye, MD;  Location: [facility] ORS;  Service: General;  Laterality: N/A;    Current Outpatient Medications  Medication Sig Dispense Refill   Accu-Chek  Softclix Lancets lancets SMARTSIG:Topical     Acetaminophen-Caffeine (EXCEDRIN TENSION HEADACHE PO) Take 2 tablets by mouth daily as needed.     albuterol (VENTOLIN HFA) 108 (90 BASE) MCG/ACT inhaler Inhale 2 puffs into the lungs every 4 (four) hours as needed.      amLODipine-benazepril (LOTREL) 5-20 MG capsule Take 1 capsule by mouth daily.     apixaban (ELIQUIS) 5 MG TABS tablet Take 1 tablet (5 mg total) by mouth 2 (two) times daily. 60 tablet 1   Ascorbic Acid (VITAMIN C) 1000 MG tablet Take 1,000 mg by mouth daily.     cetirizine (ZYRTEC) 10 MG tablet Take 10 mg by mouth.     Cholecalciferol (VITAMIN D3 PO) Take 1,000 Units by mouth every morning.     CINNAMON PO Take 2,000 mg by mouth every morning.     Coenzyme Q10 (CO Q-10) 200 MG CAPS Take 1 capsule by mouth daily.     famotidine (PEPCID) 20 MG tablet Take 20 mg by mouth 2 (two) times daily as needed.      FARXIGA 5 MG TABS tablet Take 5 mg by mouth daily.     fluticasone (FLONASE) 50 MCG/ACT nasal spray 1 SPRAY PER NARES PRN  5   KRILL OIL PO Take 1 capsule by mouth daily.     metoprolol tartrate (LOPRESSOR) 25 MG  tablet Take 1 tablet (25 mg total) by mouth 2 (two) times daily. 60 tablet 1   Multiple Vitamin (MULTI-VITAMINS) TABS Take 1 tablet by mouth daily.      Multiple Vitamin (MULTIVITAMIN ADULT PO) Take by mouth. GOLO- Taking one capsule by mouth three times daily     Omega-3 Fatty Acids (OMEGA-3 FISH OIL PO) Take 1,000 mg by mouth every morning.     QVAR REDIHALER 80 MCG/ACT inhaler Inhale 1 puff into the lungs daily.     rosuvastatin (CRESTOR) 10 MG tablet Take 10 mg by mouth at bedtime.     Testosterone 1.62 % GEL Apply 1 application topically daily.      vitamin E (VITAMIN E) 400 UNIT capsule Take 400 Units by mouth daily.     EPINEPHrine (EPIPEN IJ) Inject 1 Dose as directed as needed. (Patient not taking: Reported on 04/20/2022)     No current facility-administered medications for this encounter.    Allergies   Allergen Reactions   Bee Venom Anaphylaxis   Ibuprofen Anaphylaxis   Biaxin [Clarithromycin] Nausea And Vomiting   Mango Flavor Swelling    LIPS   Metformin And Related Diarrhea    Social History   Socioeconomic History   Marital status: Married    Spouse name: Not on file   Number of children: Not on file   Years of education: Not on file   Highest education level: Not on file  Occupational History   Not on file  Tobacco Use   Smoking status: Never   Smokeless tobacco: Current    Types: Snuff   Tobacco comments:    smokelss x 44 years (1 can every other day)  Substance and Sexual Activity   Alcohol use: Not Currently    Alcohol/week: 0.0 standard drinks of alcohol    Comment: OCC SIP MOONSHINE   Drug use: No   Sexual activity: Not on file  Other Topics Concern   Not on file  Social History Narrative   Not on file   Social Determinants of Health   Financial Resource Strain: Not on file  Food Insecurity: Not on file  Transportation Needs: Not on file  Physical Activity: Not on file  Stress: Not on file  Social Connections: Not on file  Intimate Partner Violence: Not on file    Family History  Problem Relation Age of Onset   Diabetes Father    Diabetes Brother     ROS- All systems are reviewed and negative except as per the HPI above  Physical Exam: Vitals:   04/20/22 1315  Weight: (!) 139.7 kg  Height: '6\' 1"'$  (1.854 m)   Wt Readings from Last 3 Encounters:  04/20/22 (!) 139.7 kg  04/16/22 136.1 kg  03/01/21 133.4 kg    Labs: Lab Results  Component Value Date   NA 138 04/16/2022   K 3.6 04/16/2022   CL 104 04/16/2022   CO2 26 04/16/2022   GLUCOSE 203 (H) 04/16/2022   BUN 21 (H) 04/16/2022   CREATININE 0.92 04/16/2022   CALCIUM 8.7 (L) 04/16/2022   MG 2.2 04/16/2022   No results found for: "INR" No results found for: "CHOL", "HDL", "LDLCALC", "TRIG"   GEN- The patient is well appearing, alert and oriented x 3 today.   Head-  normocephalic, atraumatic Eyes-  Sclera clear, conjunctiva pink Ears- hearing intact Oropharynx- clear Neck- supple, no JVP Lymph- no cervical lymphadenopathy Lungs- Clear to ausculation bilaterally, normal work of breathing Heart- Regular rate and rhythm, no  murmurs, rubs or gallops, PMI not laterally displaced GI- soft, NT, ND, + BS Extremities- no clubbing, cyanosis, or edema MS- no significant deformity or atrophy Skin- no rash or lesion Psych- euthymic mood, full affect Neuro- strength and sensation are intact  EKG-Vent. rate 56 BPM PR interval 168 ms QRS duration 90 ms QT/QTcB 412/397 ms P-R-T axes 49 -13 21 Sinus bradycardia Otherwise normal ECG When compared with ECG of 16-Apr-2022 03:03, PREVIOUS ECG IS PRESENT  Echo- 1. Left ventricular ejection fraction, by estimation, is 55 to 60%. The  left ventricle has normal function. The left ventricle has no regional  wall motion abnormalities. Left ventricular diastolic parameters were  normal.   2. Right ventricular systolic function is normal. The right ventricular  size is normal.   3. The mitral valve is normal in structure. Trivial mitral valve  regurgitation. No evidence of mitral stenosis.   4. The aortic valve is normal in structure. Aortic valve regurgitation is  not visualized. No aortic stenosis is present.   5. The inferior vena cava is normal in size with greater than 50%  respiratory variability, suggesting right atrial pressure of 3 mmHg.   Assessment and Plan:  1. New onset  dx of afib Now in SR Discussed afib in general with usual treatment and management Triggers discussed  Continue with metoprolol 25 mg bid   2. CHA2DS2VASc  score of 2 Continue eliquis 5 mg bid bleeding precautions discussed   3. OSA Continue CPAP  F/u with Dr. Humphrey Rolls in [place] as scheduled  Afib clinic as needed   Geroge Baseman. Latondra Gebhart, [facility] [address] [place], [place]  [address] [phone]

## 2022-05-08 ENCOUNTER — Other Ambulatory Visit: Payer: Self-pay | Admitting: Obstetrics and Gynecology

## 2022-05-25 ENCOUNTER — Other Ambulatory Visit: Payer: Self-pay

## 2022-05-25 ENCOUNTER — Emergency Department: Payer: BC Managed Care – PPO

## 2022-05-25 DIAGNOSIS — Z7901 Long term (current) use of anticoagulants: Secondary | ICD-10-CM | POA: Insufficient documentation

## 2022-05-25 DIAGNOSIS — R079 Chest pain, unspecified: Secondary | ICD-10-CM | POA: Diagnosis present

## 2022-05-25 DIAGNOSIS — R002 Palpitations: Secondary | ICD-10-CM | POA: Insufficient documentation

## 2022-05-25 DIAGNOSIS — R0789 Other chest pain: Secondary | ICD-10-CM | POA: Insufficient documentation

## 2022-05-25 DIAGNOSIS — E876 Hypokalemia: Secondary | ICD-10-CM | POA: Diagnosis not present

## 2022-05-25 LAB — BASIC METABOLIC PANEL
Anion gap: 10 (ref 5–15)
BUN: 16 mg/dL (ref 6–20)
CO2: 29 mmol/L (ref 22–32)
Calcium: 9.3 mg/dL (ref 8.9–10.3)
Chloride: 100 mmol/L (ref 98–111)
Creatinine, Ser: 1.15 mg/dL (ref 0.61–1.24)
GFR, Estimated: >60 mL/min (ref >60)
Glucose, Bld: 202 mg/dL — ABNORMAL HIGH (ref 70–99)
Potassium: 3.4 mmol/L — ABNORMAL LOW (ref 3.5–5.1)
Sodium: 139 mmol/L (ref 135–145)

## 2022-05-25 LAB — CBC
HCT: 48.4 % (ref 39.0–52.0)
Hemoglobin: 15.5 g/dL (ref 13.0–17.0)
MCH: 27.9 pg (ref 26.0–34.0)
MCHC: 32 g/dL (ref 30.0–36.0)
MCV: 87.2 fL (ref 80.0–100.0)
Platelets: 207 10*3/uL (ref 150–400)
RBC: 5.55 MIL/uL (ref 4.22–5.81)
RDW: 12.9 % (ref 11.5–15.5)
WBC: 8.5 10*3/uL (ref 4.0–10.5)
nRBC: 0 % (ref 0.0–0.2)

## 2022-05-25 LAB — TROPONIN I (HIGH SENSITIVITY): Troponin I (High Sensitivity): 5 ng/L (ref <18)

## 2022-05-25 NOTE — ED Notes (Addendum)
O2 sat 88%. Pt placed on 2 L [place]. Pt denies any SHOB

## 2022-05-25 NOTE — ED Triage Notes (Signed)
Pt arrives with c/o chest pain that started tonight. Per pt, pain radiated into his jaw. Pt endorses SOB.

## 2022-05-26 ENCOUNTER — Emergency Department
Admission: EM | Admit: 2022-05-26 | Discharge: 2022-05-26 | Disposition: A | Discharge status: Home / Self Care | Payer: BC Managed Care – PPO | Attending: Emergency Medicine | Admitting: Emergency Medicine

## 2022-05-26 DIAGNOSIS — E876 Hypokalemia: Secondary | ICD-10-CM

## 2022-05-26 DIAGNOSIS — R0789 Other chest pain: Secondary | ICD-10-CM

## 2022-05-26 DIAGNOSIS — R002 Palpitations: Secondary | ICD-10-CM

## 2022-05-26 LAB — TROPONIN I (HIGH SENSITIVITY): Troponin I (High Sensitivity): 5 ng/L (ref <18)

## 2022-05-26 MED ORDER — MAGNESIUM OXIDE -MG SUPPLEMENT 400 (240 MG) MG PO TABS
400.0000 mg | ORAL_TABLET | Freq: Once | ORAL | Status: AC
  Administered 2022-05-26: 400 mg via ORAL
  Filled 2022-05-26: qty 1

## 2022-05-26 MED ORDER — POTASSIUM CHLORIDE CRYS ER 20 MEQ PO TBCR
40.0000 meq | EXTENDED_RELEASE_TABLET | Freq: Once | ORAL | Status: AC
  Administered 2022-05-26: 40 meq via ORAL
  Filled 2022-05-26: qty 2

## 2022-05-26 NOTE — ED Provider Notes (Signed)
[facility] Provider Note    Event Date/Time   First MD Initiated Contact with Patient 05/26/22 0319     (approximate)   History   Chest Pain   HPI  Patrick Burns is a 61 y.o. male who presents to the ED for evaluation of Chest Pain   I review 8/20 medical DC summary.  Patient was admitted briefly for new onset A-fib with RVR symptomatic with chest discomfort.  Started on apixaban and metoprolol. I review a follow-up A-fib clinic on 8/24.  Patient presents to the ED after a 20-minute episode of palpitations that occurred earlier this evening.  Reports compliance with his metoprolol and Eliquis.  No pain or pressure.  Feels fine now.  No complaints.   Physical Exam   Triage Vital Signs: ED Triage Vitals  Enc Vitals Group     BP 05/25/22 2103 106/67     Pulse Rate 05/25/22 2103 (!) 101     Resp 05/25/22 2103 18     Temp 05/25/22 2103 97.7 F (36.5 C)     Temp Source 05/25/22 2103 Oral     SpO2 05/25/22 2105 95 %     Weight 05/25/22 2106 (!) 308 lb (139.7 kg)     Height --      Head Circumference --      Peak Flow --      Pain Score 05/25/22 2106 0     Pain Loc --      Pain Edu? --      Excl. in [place]? --     Most recent vital signs: Vitals:   05/26/22 0451 05/26/22 0454  BP: (!) 94/59 (!) 94/58  Pulse: 72 81  Resp: 20 20  Temp: 98.4 F (36.9 C) 98.3 F (36.8 C)  SpO2: 95% 94%    General: Awake, no distress.  CV:  Good peripheral perfusion. RRR Resp:  Normal effort.  Abd:  No distention.  MSK:  No deformity noted.  Neuro:  No focal deficits appreciated. Other:     ED Results / Procedures / Treatments   Labs (all labs ordered are listed, but only abnormal results are displayed) Labs Reviewed  BASIC METABOLIC PANEL - Abnormal; Notable for the following components:      Result Value   Potassium 3.4 (*)    Glucose, Bld 202 (*)    All other components within normal limits  CBC  TROPONIN I (HIGH SENSITIVITY)  TROPONIN I  (HIGH SENSITIVITY)    EKG Sinus rhythm with a rate of 93 bpm.  Normal axis and left anterior fascicular block.  No evidence of acute ischemia.  Nonspecific ST changes to lead III  RADIOLOGY CXR interpreted by me without evidence of acute cardiopulmonary pathology.  Official radiology report(s): DG Chest 2 View  Result Date: 05/25/2022 CLINICAL DATA:  Chest pain EXAM: CHEST - 2 VIEW COMPARISON:  04/16/2022 FINDINGS: Heart and mediastinal contours are within normal limits. No focal opacities or effusions. No acute bony abnormality. IMPRESSION: No active cardiopulmonary disease. Electronically Signed   By: Rolm Baptise M.D.   On: 05/25/2022 21:31    PROCEDURES and INTERVENTIONS:  .1-3 Lead EKG Interpretation  Performed by: Vladimir Crofts, MD Authorized by: Vladimir Crofts, MD     Interpretation: normal     ECG rate:  78   ECG rate assessment: normal     Rhythm: sinus rhythm     Ectopy: none     Conduction: normal     Medications  potassium chloride SA (KLOR-CON M) CR tablet 40 mEq (40 mEq Oral Given 05/26/22 0437)  magnesium oxide (MAG-OX) tablet 400 mg (400 mg Oral Given 05/26/22 0437)     IMPRESSION / MDM / ASSESSMENT AND PLAN / ED COURSE  I reviewed the triage vital signs and the nursing notes.  Differential diagnosis includes, but is not limited to, ACS, PTX, PNA, muscle strain/spasm, PE, dissection, electrolyte derangement.  Symptomatic anemia  {Patient presents with symptoms of an acute illness or injury that is potentially life-threatening.  61 year old male who was recently diagnosed with A-fib presents with a brief episode of palpitations, possibly related to mild hypokalemia, but ultimately suitable for outpatient management.  He looks systemically well and is asymptomatic.  Nonischemic EKG and a sinus rhythm without recurrence of A-fib while on the monitor here.  His potassium was noted to be slightly low, so this is repleted orally as well as empiric magnesium alongside  this.  Normal CBC and negative troponin x2.  We will discharge with cardiology follow-up and return precautions.  Clinical Course as of 05/26/22 0600  Fri May 26, 2022  0449 Reassessed.  Feeling well.  Requesting discharge.  He is noted to have a couple intermittent episodes of hypoxia while asleep, suspect OSA in the setting of his obesity and comorbidities.  Saturations normalized immediately upon awakening [DS]    Clinical Course User Index [DS] Vladimir Crofts, MD     FINAL CLINICAL IMPRESSION(S) / ED DIAGNOSES   Final diagnoses:  Palpitations  Other chest pain  Hypokalemia     Rx / DC Orders   ED Discharge Orders     None        Note:  This document was prepared using Dragon voice recognition software and may include unintentional dictation errors.   Vladimir Crofts, MD 05/26/22 0600

## 2022-05-26 NOTE — ED Notes (Signed)
Pt reports that tonight he was eating tacos and a torta from a food truck.  He reports he ate the pulp from the lime also and about 20-30 minutes later he started having chest and jaw pain with palpitations.  Also reports that this is the second time this has happened in the past three months. Pt denies chest pain or discomfort at this time.

## 2022-05-26 NOTE — ED Notes (Signed)
DC instructions given verbally and in writing, understanding voiced, signature obtained.  Pt left in stable condition with wife to POV.

## 2022-09-13 ENCOUNTER — Encounter: Payer: Self-pay | Admitting: Emergency Medicine

## 2022-09-13 ENCOUNTER — Emergency Department: Payer: BC Managed Care – PPO

## 2022-09-13 DIAGNOSIS — Z20822 Contact with and (suspected) exposure to covid-19: Secondary | ICD-10-CM | POA: Insufficient documentation

## 2022-09-13 DIAGNOSIS — J029 Acute pharyngitis, unspecified: Secondary | ICD-10-CM | POA: Diagnosis present

## 2022-09-13 DIAGNOSIS — B349 Viral infection, unspecified: Secondary | ICD-10-CM | POA: Insufficient documentation

## 2022-09-13 NOTE — ED Triage Notes (Signed)
Pt presents via POV with complaints of sore throat that started yesterday. Pt has been taking Mucinex for some post-nasal drip and cough with improvement. Denies fevers, chills, CP or SOB.

## 2022-09-14 ENCOUNTER — Emergency Department
Admission: EM | Admit: 2022-09-14 | Discharge: 2022-09-14 | Disposition: A | Discharge status: Home / Self Care | Payer: BC Managed Care – PPO | Attending: Emergency Medicine | Admitting: Emergency Medicine

## 2022-09-14 DIAGNOSIS — B349 Viral infection, unspecified: Secondary | ICD-10-CM

## 2022-09-14 DIAGNOSIS — J029 Acute pharyngitis, unspecified: Secondary | ICD-10-CM

## 2022-09-14 LAB — GROUP A STREP BY PCR: Group A Strep by PCR: NOT DETECTED

## 2022-09-14 LAB — RESP PANEL BY RT-PCR (RSV, FLU A&B, COVID)  RVPGX2
Influenza A by PCR: NEGATIVE
Influenza B by PCR: NEGATIVE
Resp Syncytial Virus by PCR: NEGATIVE
SARS Coronavirus 2 by RT PCR: NEGATIVE

## 2022-09-14 MED ORDER — BENZONATATE 100 MG PO CAPS: 100.0000 mg | ORAL_CAPSULE | Freq: Four times a day (QID) | ORAL | 0 refills | Status: DC | PRN

## 2022-09-14 MED ORDER — ACETAMINOPHEN 500 MG PO TABS
1000.0000 mg | ORAL_TABLET | Freq: Once | ORAL | Status: AC
  Administered 2022-09-14: 1000 mg via ORAL
  Filled 2022-09-14: qty 2

## 2022-09-14 NOTE — ED Notes (Signed)
ED Provider at bedside. 

## 2022-09-14 NOTE — ED Provider Notes (Signed)
[facility] Provider Note    Event Date/Time   First MD Initiated Contact with Patient 09/14/22 0131     (approximate)   History   Sore Throat   HPI  Patrick Burns is a 62 y.o. male who presents to the ED for evaluation of Sore Throat   I review and 03/2022 medical DC summary patient was briefly admitted for new onset A-fib.  Started on Eliquis.  Patient presents to the ED for evaluation of a couple days of a sore throat, facial pressure, headache and nonproductive cough.  Reports postnasal drip improved with Mucinex with improved respiratory congestion.  Reports improved symptoms with taking his wife's Tessalon Perles and he requests a prescription for this medication.  No fevers, no chest pain, no palpitations or dyspnea.   Physical Exam   Triage Vital Signs: ED Triage Vitals  Enc Vitals Group     BP 09/13/22 2318 (!) 134/90     Pulse Rate 09/13/22 2318 92     Resp 09/13/22 2318 18     Temp 09/13/22 2318 98.6 F (37 C)     Temp Source 09/13/22 2318 Oral     SpO2 09/13/22 2318 96 %     Weight 09/13/22 2318 (!) 311 lb 4.6 oz (141.2 kg)     Height 09/13/22 2318 '6\' 1"'$  (1.854 m)     Head Circumference --      Peak Flow --      Pain Score 09/13/22 2316 6     Pain Loc --      Pain Edu? --      Excl. in [place]? --     Most recent vital signs: Vitals:   09/13/22 2318 09/14/22 0146  BP: (!) 134/90   Pulse: 92 82  Resp: 18 18  Temp: 98.6 F (37 C)   SpO2: 96% 94%    General: Awake, no distress.  Posterior oropharynx is slightly erythematous diffusely.  Uvula is midline.  No signs of PTA CV:  Good peripheral perfusion.  Resp:  Normal effort.  Abd:  No distention.  MSK:  No deformity noted.  Neuro:  No focal deficits appreciated. Cranial nerves II through XII intact 5/5 strength and sensation in all 4 extremities Other:     ED Results / Procedures / Treatments   Labs (all labs ordered are listed, but only abnormal results are  displayed) Labs Reviewed  RESP PANEL BY RT-PCR (RSV, FLU A&B, COVID)  [id number]  GROUP A STREP BY PCR    EKG   RADIOLOGY CXR interpreted by me without evidence of acute cardiopulmonary pathology.  Official radiology report(s): DG Chest 2 View  Result Date: 09/13/2022 CLINICAL DATA:  Cough and sore throat starting yesterday. EXAM: CHEST - 2 VIEW COMPARISON:  05/25/2022 FINDINGS: The heart size and mediastinal contours are within normal limits. Both lungs are clear. The visualized skeletal structures are unremarkable. IMPRESSION: No active cardiopulmonary disease. Electronically Signed   By: Lucienne Capers M.D.   On: 09/13/2022 23:37    PROCEDURES and INTERVENTIONS:  Procedures  Medications  acetaminophen (TYLENOL) tablet 1,000 mg (1,000 mg Oral Given 09/14/22 0153)     IMPRESSION / MDM / ASSESSMENT AND PLAN / ED COURSE  I reviewed the triage vital signs and the nursing notes.  Differential diagnosis includes, but is not limited to, strep throat, viral syndrome, PTA, pneumonia  62 year old patient presents with a couple days of a sore throat, looking well and likely viral syndrome suitable for  outpatient management.  Normal vital signs.  He is ambulatory without distress and has a reassuring examination.  Erythematous posterior oropharynx without stigmata of PTA.  Tested negative for group A strep, COVID, RSV and flu.  Clear CXR.  Suspect viral syndrome.  No indications for antibiotics.  We discussed supportive measures and return precautions      FINAL CLINICAL IMPRESSION(S) / ED DIAGNOSES   Final diagnoses:  Acute viral syndrome  Sore throat     Rx / DC Orders   ED Discharge Orders          Ordered    benzonatate (TESSALON PERLES) 100 MG capsule  Every 6 hours PRN        09/14/22 0153             Note:  This document was prepared using Dragon voice recognition software and may include unintentional dictation errors.   Vladimir Crofts, MD 09/14/22 [phone]

## 2022-09-14 NOTE — Discharge Instructions (Addendum)
Use Tylenol for pain and fevers.  Up to 1000 mg per dose, up to 4 times per day.  Do not take more than 4000 mg of Tylenol/acetaminophen within 24 hours..  

## 2022-09-14 NOTE — ED Notes (Signed)
Pt brought to hallway 5, this RN now assuming care.

## 2022-09-29 ENCOUNTER — Telehealth: Payer: Self-pay

## 2022-09-29 NOTE — Telephone Encounter (Signed)
Patient is calling for another round of ABX

## 2022-10-02 ENCOUNTER — Other Ambulatory Visit: Payer: Self-pay

## 2022-10-02 MED ORDER — DOXYCYCLINE HYCLATE 100 MG PO CAPS: 100.0000 mg | ORAL_CAPSULE | Freq: Two times a day (BID) | ORAL | 0 refills | Status: DC

## 2022-10-05 ENCOUNTER — Encounter (HOSPITAL_COMMUNITY): Payer: Self-pay | Admitting: *Deleted

## 2022-10-11 ENCOUNTER — Other Ambulatory Visit: Payer: Self-pay | Admitting: Internal Medicine

## 2022-10-11 DIAGNOSIS — K219 Gastro-esophageal reflux disease without esophagitis: Secondary | ICD-10-CM

## 2022-10-12 ENCOUNTER — Other Ambulatory Visit: Payer: Self-pay | Admitting: Internal Medicine

## 2022-10-13 ENCOUNTER — Other Ambulatory Visit: Payer: Self-pay | Admitting: Internal Medicine

## 2022-10-13 ENCOUNTER — Ambulatory Visit (INDEPENDENT_AMBULATORY_CARE_PROVIDER_SITE_OTHER): Payer: BC Managed Care – PPO | Admitting: Internal Medicine

## 2022-10-13 VITALS — BP 132/70 | HR 82 | Ht 72.0 in | Wt 309.0 lb

## 2022-10-13 DIAGNOSIS — I10 Essential (primary) hypertension: Secondary | ICD-10-CM | POA: Diagnosis not present

## 2022-10-13 DIAGNOSIS — J218 Acute bronchiolitis due to other specified organisms: Secondary | ICD-10-CM

## 2022-10-13 DIAGNOSIS — E1165 Type 2 diabetes mellitus with hyperglycemia: Secondary | ICD-10-CM | POA: Diagnosis not present

## 2022-10-13 DIAGNOSIS — J4520 Mild intermittent asthma, uncomplicated: Secondary | ICD-10-CM

## 2022-10-13 LAB — POCT CBG (FASTING - GLUCOSE)-MANUAL ENTRY: Glucose Fasting, POC: 128 mg/dL — AB (ref 70–99)

## 2022-10-13 MED ORDER — PREDNISONE 50 MG PO TABS: ORAL_TABLET | ORAL | 0 refills | Status: DC

## 2022-10-13 MED ORDER — BENZONATATE 100 MG PO CAPS: 100.0000 mg | ORAL_CAPSULE | Freq: Four times a day (QID) | ORAL | 0 refills | Status: DC | PRN

## 2022-10-13 MED ORDER — QVAR REDIHALER 80 MCG/ACT IN AERB: 1.0000 | INHALATION_SPRAY | Freq: Every day | RESPIRATORY_TRACT | 2 refills | Status: DC

## 2022-10-13 NOTE — Progress Notes (Signed)
Established Patient Office Visit  Subjective:  Patient ID: Patrick Burns, male    DOB: 01/08/1961  Age: 62 y.o. MRN: [id number]  Chief Complaint  Patient presents with   Acute Visit    Still Sick can't shake what he had a month ago URI    Still c/o cough productive of yellow brownish sputum and wheezing particularly at night. Hasn't used his maintenance inhaler recently. Fructosamine reflects poorly controlled diabetes.    Past Medical History:  Diagnosis Date   Asthma    Blood dyscrasia    "BLOOD IS THICK"   Diabetes mellitus without complication (HCC)    Dyspnea on exertion    GERD (gastroesophageal reflux disease)    Headache    MIGRAINES   Hypertension    Hypogonadism male    Leaky heart valve    Metastatic cancer ([place]) 2006   Rectum --> prostate --> bladder/chemo and radiation/ [facility]/squamous cell carcinoma anal rectum area   Peripheral edema    Sleep apnea    NOT USING CPAP CURRENTLY   Umbilical hernia     Social History   Socioeconomic History   Marital status: Married    Spouse name: Not on file   Number of children: Not on file   Years of education: Not on file   Highest education level: Not on file  Occupational History   Not on file  Tobacco Use   Smoking status: Never   Smokeless tobacco: Current    Types: Snuff   Tobacco comments:    smokelss x 44 years (1 can every other day)  Substance and Sexual Activity   Alcohol use: Not Currently    Alcohol/week: 0.0 standard drinks of alcohol    Comment: OCC SIP MOONSHINE   Drug use: No   Sexual activity: Not on file  Other Topics Concern   Not on file  Social History Narrative   Not on file   Social Determinants of Health   Financial Resource Strain: Not on file  Food Insecurity: Not on file  Transportation Needs: Not on file  Physical Activity: Not on file  Stress: Not on file  Social Connections: Not on file  Intimate Partner Violence: Not on file    Family History  Problem  Relation Age of Onset   Diabetes Father    Diabetes Brother     Allergies  Allergen Reactions   Bee Venom Anaphylaxis   Ibuprofen Anaphylaxis   Biaxin [Clarithromycin] Nausea And Vomiting   Mango Flavor Swelling    LIPS   Metformin And Related Diarrhea    Review of Systems  All other systems reviewed and are negative.      Objective:   BP 132/70   Pulse 82   Ht 6' (1.829 m)   Wt (!) 309 lb (140.2 kg)   SpO2 91%   BMI 41.91 kg/m   Vitals:   10/13/22 1431  BP: 132/70  Pulse: 82  Height: 6' (1.829 m)  Weight: (!) 309 lb (140.2 kg)  SpO2: 91%  BMI (Calculated): 41.9    Physical Exam Vitals reviewed.  Constitutional:      Appearance: Normal appearance. He is obese.  HENT:     Head: Normocephalic.     Left Ear: There is no impacted cerumen.     Nose: Nose normal.     Mouth/Throat:     Mouth: Mucous membranes are moist.     Pharynx: No posterior oropharyngeal erythema.  Eyes:     Extraocular  Movements: Extraocular movements intact.     Pupils: Pupils are equal, round, and reactive to light.  Cardiovascular:     Rate and Rhythm: Regular rhythm.     Chest Wall: PMI is not displaced.     Pulses: Normal pulses.     Heart sounds: Normal heart sounds. No murmur heard. Pulmonary:     Effort: Pulmonary effort is normal.     Breath sounds: Normal air entry. No rhonchi or rales.  Abdominal:     General: Abdomen is flat. Bowel sounds are normal. There is no distension.     Palpations: Abdomen is soft. There is no hepatomegaly, splenomegaly or mass.     Tenderness: There is no abdominal tenderness.  Musculoskeletal:        General: Normal range of motion.     Cervical back: Normal range of motion and neck supple.     Right lower leg: No edema.     Left lower leg: No edema.  Skin:    General: Skin is warm and dry.  Neurological:     General: No focal deficit present.     Mental Status: He is alert and oriented to person, place, and time.     Cranial Nerves: No  cranial nerve deficit.     Motor: No weakness.  Psychiatric:        Mood and Affect: Mood normal.        Behavior: Behavior normal.      Results for orders placed or performed in visit on 10/13/22  POCT CBG (Fasting - Glucose)  Result Value Ref Range   Glucose Fasting, POC 128 (A) 70 - 99 mg/dL    Recent Results (from the past 2160 hour(Godwin Tedesco))  Resp panel by RT-PCR (RSV, Flu A&B, Covid) Anterior Nasal Swab     Status: None   Collection Time: 09/13/22 11:17 PM   Specimen: Anterior Nasal Swab  Result Value Ref Range   SARS Coronavirus 2 by RT PCR NEGATIVE NEGATIVE    Comment: (NOTE) SARS-CoV-2 target nucleic acids are NOT DETECTED.  The SARS-CoV-2 RNA is generally detectable in upper respiratory specimens during the acute phase of infection. The lowest concentration of SARS-CoV-2 viral copies this assay can detect is 138 copies/mL. A negative result does not preclude SARS-Cov-2 infection and should not be used as the sole basis for treatment or other patient management decisions. A negative result may occur with  improper specimen collection/handling, submission of specimen other than nasopharyngeal swab, presence of viral mutation(Graden Hoshino) within the areas targeted by this assay, and inadequate number of viral copies(<138 copies/mL). A negative result must be combined with clinical observations, patient history, and epidemiological information. The expected result is Negative.  Fact Sheet for Patients:  EntrepreneurPulse.com.au  Fact Sheet for Healthcare Providers:  IncredibleEmployment.be  This test is no t yet approved or cleared by the [place] FDA and  has been authorized for detection and/or diagnosis of SARS-CoV-2 by FDA under an Emergency Use Authorization (EUA). This EUA will remain  in effect (meaning this test can be used) for the duration of the COVID-19 declaration under Section 564(b)(1) of the Act, 21 U.Godfrey Tritschler.C.section  360bbb-3(b)(1), unless the authorization is terminated  or revoked sooner.       Influenza A by PCR NEGATIVE NEGATIVE   Influenza B by PCR NEGATIVE NEGATIVE    Comment: (NOTE) The Xpert Xpress SARS-CoV-2/FLU/RSV plus assay is intended as an aid in the diagnosis of influenza from Nasopharyngeal swab specimens and should not be used  as a sole basis for treatment. Nasal washings and aspirates are unacceptable for Xpert Xpress SARS-CoV-2/FLU/RSV testing.  Fact Sheet for Patients: EntrepreneurPulse.com.au  Fact Sheet for Healthcare Providers: IncredibleEmployment.be  This test is not yet approved or cleared by the [place] FDA and has been authorized for detection and/or diagnosis of SARS-CoV-2 by FDA under an Emergency Use Authorization (EUA). This EUA will remain in effect (meaning this test can be used) for the duration of the COVID-19 declaration under Section 564(b)(1) of the Act, 21 U.Nain Rudd.C. section 360bbb-3(b)(1), unless the authorization is terminated or revoked.     Resp Syncytial Virus by PCR NEGATIVE NEGATIVE    Comment: (NOTE) Fact Sheet for Patients: EntrepreneurPulse.com.au  Fact Sheet for Healthcare Providers: IncredibleEmployment.be  This test is not yet approved or cleared by the [place] FDA and has been authorized for detection and/or diagnosis of SARS-CoV-2 by FDA under an Emergency Use Authorization (EUA). This EUA will remain in effect (meaning this test can be used) for the duration of the COVID-19 declaration under Section 564(b)(1) of the Act, 21 U.Anayelli Lai.C. section 360bbb-3(b)(1), unless the authorization is terminated or revoked.  Performed at [facility], [place], [place] [address]   Group A Strep by PCR [facility] Only)     Status: None   Collection Time: 09/13/22 11:17 PM   Specimen: Anterior Nasal Swab; Sterile Swab  Result Value Ref Range    Group A Strep by PCR NOT DETECTED NOT DETECTED    Comment: Performed at [facility], [place], [place] [address]  POCT CBG (Fasting - Glucose)     Status: Abnormal   Collection Time: 10/13/22  2:36 PM  Result Value Ref Range   Glucose Fasting, POC 128 (A) 70 - 99 mg/dL      Assessment & Plan:   Problem List Items Addressed This Visit       Endocrine   Uncontrolled type 2 diabetes mellitus with hyperglycemia, without long-term current use of insulin (HCC) - Primary   Relevant Orders   POCT CBG (Fasting - Glucose) (Completed)  1. Uncontrolled type 2 diabetes mellitus with hyperglycemia, without long-term current use of insulin (HCC) - POCT CBG (Fasting - Glucose)  2. Essential hypertension  3. Mild intermittent asthma without complication - QVAR REDIHALER 80 MCG/ACT inhaler; Inhale 1 puff into the lungs daily.  Dispense: 1 each; Refill: 2  4. Acute bronchiolitis due to other specified organisms - predniSONE (DELTASONE) 50 MG tablet; 1 daily  Dispense: 5 tablet; Refill: 0 - benzonatate (TESSALON PERLES) 100 MG capsule; Take 1 capsule (100 mg total) by mouth every 6 (six) hours as needed for cough.  Dispense: 30 capsule; Refill: 0    No follow-ups on file.   Total time spent: 20 minutes  Volanda Napoleon, MD  10/13/2022

## 2022-10-18 ENCOUNTER — Other Ambulatory Visit: Payer: Self-pay | Admitting: Internal Medicine

## 2022-10-18 DIAGNOSIS — E119 Type 2 diabetes mellitus without complications: Secondary | ICD-10-CM

## 2022-11-01 ENCOUNTER — Other Ambulatory Visit: Payer: Self-pay

## 2022-11-10 ENCOUNTER — Ambulatory Visit: Payer: BC Managed Care – PPO | Admitting: Internal Medicine

## 2022-11-13 ENCOUNTER — Other Ambulatory Visit: Payer: Self-pay | Admitting: Internal Medicine

## 2022-11-13 ENCOUNTER — Other Ambulatory Visit: Payer: BC Managed Care – PPO

## 2022-11-14 LAB — LIPID PANEL W/O CHOL/HDL RATIO
Cholesterol, Total: 232 mg/dL — ABNORMAL HIGH (ref 100–199)
HDL: 36 mg/dL — ABNORMAL LOW (ref >39)
LDL Chol Calc (NIH): 144 mg/dL — ABNORMAL HIGH (ref 0–99)
Triglycerides: 285 mg/dL — ABNORMAL HIGH (ref 0–149)
VLDL Cholesterol Cal: 52 mg/dL — ABNORMAL HIGH (ref 5–40)

## 2022-11-14 LAB — CMP14+EGFR
ALT: 18 IU/L (ref 0–44)
AST: 13 IU/L (ref 0–40)
Albumin/Globulin Ratio: 1.4 (ref 1.2–2.2)
Albumin: 4.2 g/dL (ref 3.8–4.9)
Alkaline Phosphatase: 65 IU/L (ref 44–121)
BUN/Creatinine Ratio: 14 (ref 10–24)
BUN: 13 mg/dL (ref 8–27)
Bilirubin Total: 0.5 mg/dL (ref 0.0–1.2)
CO2: 23 mmol/L (ref 20–29)
Calcium: 9.3 mg/dL (ref 8.6–10.2)
Chloride: 100 mmol/L (ref 96–106)
Creatinine, Ser: 0.94 mg/dL (ref 0.76–1.27)
Globulin, Total: 2.9 g/dL (ref 1.5–4.5)
Glucose: 126 mg/dL — ABNORMAL HIGH (ref 70–99)
Potassium: 4.3 mmol/L (ref 3.5–5.2)
Sodium: 140 mmol/L (ref 134–144)
Total Protein: 7.1 g/dL (ref 6.0–8.5)
eGFR: 93 mL/min/{1.73_m2} (ref >59)

## 2022-11-14 LAB — HGB A1C W/O EAG: Hgb A1c MFr Bld: 7.6 % — ABNORMAL HIGH (ref 4.8–5.6)

## 2022-11-17 ENCOUNTER — Encounter: Payer: Self-pay | Admitting: Internal Medicine

## 2022-11-17 ENCOUNTER — Ambulatory Visit (INDEPENDENT_AMBULATORY_CARE_PROVIDER_SITE_OTHER): Payer: BC Managed Care – PPO | Admitting: Internal Medicine

## 2022-11-17 VITALS — BP 112/80 | HR 76 | Temp 96.8°F | Ht 78.0 in | Wt 315.5 lb

## 2022-11-17 DIAGNOSIS — I872 Venous insufficiency (chronic) (peripheral): Secondary | ICD-10-CM | POA: Diagnosis not present

## 2022-11-17 DIAGNOSIS — E782 Mixed hyperlipidemia: Secondary | ICD-10-CM | POA: Diagnosis not present

## 2022-11-17 DIAGNOSIS — I10 Essential (primary) hypertension: Secondary | ICD-10-CM | POA: Diagnosis not present

## 2022-11-17 DIAGNOSIS — E1165 Type 2 diabetes mellitus with hyperglycemia: Secondary | ICD-10-CM | POA: Diagnosis not present

## 2022-11-17 DIAGNOSIS — E119 Type 2 diabetes mellitus without complications: Secondary | ICD-10-CM

## 2022-11-17 DIAGNOSIS — Z6836 Body mass index (BMI) 36.0-36.9, adult: Secondary | ICD-10-CM

## 2022-11-17 LAB — GLUCOSE, POCT (MANUAL RESULT ENTRY): POC Glucose: 117 mg/dl — AB (ref 70–99)

## 2022-11-17 MED ORDER — SEMAGLUTIDE(0.25 OR 0.5MG/DOS) 2 MG/3ML ~~LOC~~ SOPN: PEN_INJECTOR | SUBCUTANEOUS | 1 refills | Status: DC

## 2022-11-17 MED ORDER — DAPAGLIFLOZIN PROPANEDIOL 10 MG PO TABS: 10.0000 mg | ORAL_TABLET | Freq: Every day | ORAL | 2 refills | Status: DC

## 2022-11-17 NOTE — Progress Notes (Signed)
Established Patient Office Visit  Subjective:  Patient ID: Patrick Burns, male    DOB: [birth year]  Age: 62 y.o. MRN: [id number]  Chief Complaint  Patient presents with   Follow-up    4 week lab results    No new complaints, here for lab review and medication refills. Labs reviewed and notable for deterioration in diabetes and LDL to which he admits dietary indiscretion.     No other concerns at this time.   Past Medical History:  Diagnosis Date   Asthma    Blood dyscrasia    "BLOOD IS THICK"   Diabetes mellitus without complication (HCC)    Dyspnea on exertion    GERD (gastroesophageal reflux disease)    Headache    MIGRAINES   Hypertension    Hypogonadism male    Leaky heart valve    Metastatic cancer ([place]) 2006   Rectum --> prostate --> bladder/chemo and radiation/ [facility]/squamous cell carcinoma anal rectum area   Peripheral edema    Sleep apnea    NOT USING CPAP CURRENTLY   Umbilical hernia     Past Surgical History:  Procedure Laterality Date   COLONOSCOPY  2011   [facility]   KNEE SURGERY     TONSILLECTOMY     UMBILICAL HERNIA REPAIR N/A 05/06/2015   Procedure: HERNIA REPAIR UMBILICAL ADULT;  Surgeon: Christene Lye, MD;  Location: [facility] ORS;  Service: General;  Laterality: N/A;    Social History   Socioeconomic History   Marital status: Married    Spouse name: Not on file   Number of children: Not on file   Years of education: Not on file   Highest education level: Not on file  Occupational History   Not on file  Tobacco Use   Smoking status: Never   Smokeless tobacco: Current    Types: Snuff   Tobacco comments:    smokelss x 44 years (1 can every other day)  Substance and Sexual Activity   Alcohol use: Not Currently    Alcohol/week: 0.0 standard drinks of alcohol    Comment: OCC SIP MOONSHINE   Drug use: No   Sexual activity: Not on file  Other Topics Concern   Not on file  Social History Narrative   Not on file   Social  Determinants of Health   Financial Resource Strain: Not on file  Food Insecurity: Not on file  Transportation Needs: Not on file  Physical Activity: Not on file  Stress: Not on file  Social Connections: Not on file  Intimate Partner Violence: Not on file    Family History  Problem Relation Age of Onset   Diabetes Father    Diabetes Brother     Allergies  Allergen Reactions   Bee Venom Anaphylaxis   Ibuprofen Anaphylaxis   Biaxin [Clarithromycin] Nausea And Vomiting   Mango Flavor Swelling    LIPS   Metformin And Related Diarrhea    Review of Systems  Constitutional: Negative.        Gained 2 lb weight  HENT: Negative.    Eyes: Negative.   Respiratory: Negative.    Cardiovascular: Negative.   Gastrointestinal: Negative.   Genitourinary:  Positive for frequency.  Skin: Negative.   Neurological: Negative.   Endo/Heme/Allergies:  Positive for polydipsia.       Objective:   BP 112/80   Pulse 76   Temp (!) 96.8 F (36 C) (Tympanic)   Ht 6\' 6"  (1.981 m)   Wt Marland Kitchen)  315 lb 8 oz (143.1 kg)   SpO2 93%   BMI 36.46 kg/m   Vitals:   11/17/22 1016  BP: 112/80  Pulse: 76  Temp: (!) 96.8 F (36 C)  Height: 6\' 6"  (1.981 m)  Weight: (!) 315 lb 8 oz (143.1 kg)  SpO2: 93%  TempSrc: Tympanic  BMI (Calculated): 36.47    Physical Exam Vitals reviewed.  Constitutional:      Appearance: Normal appearance. He is obese.  HENT:     Head: Normocephalic.     Left Ear: There is no impacted cerumen.     Nose: Nose normal.     Mouth/Throat:     Mouth: Mucous membranes are moist.     Pharynx: No posterior oropharyngeal erythema.  Eyes:     Extraocular Movements: Extraocular movements intact.     Pupils: Pupils are equal, round, and reactive to light.  Cardiovascular:     Rate and Rhythm: Regular rhythm.     Chest Wall: PMI is not displaced.     Pulses: Normal pulses.     Heart sounds: Normal heart sounds. No murmur heard. Pulmonary:     Effort: Pulmonary effort is  normal.     Breath sounds: Normal air entry. No rhonchi or rales.  Abdominal:     General: Abdomen is flat. Bowel sounds are normal. There is no distension.     Palpations: Abdomen is soft. There is no hepatomegaly, splenomegaly or mass.     Tenderness: There is no abdominal tenderness.  Musculoskeletal:        General: Normal range of motion.     Cervical back: Normal range of motion and neck supple.     Right lower leg: No edema.     Left lower leg: No edema.  Skin:    General: Skin is warm and dry.  Neurological:     General: No focal deficit present.     Mental Status: He is alert and oriented to person, place, and time.     Cranial Nerves: No cranial nerve deficit.     Motor: No weakness.  Psychiatric:        Mood and Affect: Mood normal.        Behavior: Behavior normal.      Results for orders placed or performed in visit on 11/17/22  POCT Glucose (CBG)  Result Value Ref Range   POC Glucose 117 (A) 70 - 99 mg/dl    Recent Results (from the past 2160 hour(Lando Alcalde))  Resp panel by RT-PCR (RSV, Flu A&B, Covid) Anterior Nasal Swab     Status: None   Collection Time: 09/13/22 11:17 PM   Specimen: Anterior Nasal Swab  Result Value Ref Range   SARS Coronavirus 2 by RT PCR NEGATIVE NEGATIVE    Comment: (NOTE) SARS-CoV-2 target nucleic acids are NOT DETECTED.  The SARS-CoV-2 RNA is generally detectable in upper respiratory specimens during the acute phase of infection. The lowest concentration of SARS-CoV-2 viral copies this assay can detect is 138 copies/mL. A negative result does not preclude SARS-Cov-2 infection and should not be used as the sole basis for treatment or other patient management decisions. A negative result may occur with  improper specimen collection/handling, submission of specimen other than nasopharyngeal swab, presence of viral mutation(Amberlynn Tempesta) within the areas targeted by this assay, and inadequate number of viral copies(<138 copies/mL). A negative result  must be combined with clinical observations, patient history, and epidemiological information. The expected result is Negative.  Fact Sheet for  Patients:  EntrepreneurPulse.com.au  Fact Sheet for Healthcare Providers:  IncredibleEmployment.be  This test is no t yet approved or cleared by the [place] FDA and  has been authorized for detection and/or diagnosis of SARS-CoV-2 by FDA under an Emergency Use Authorization (EUA). This EUA will remain  in effect (meaning this test can be used) for the duration of the COVID-19 declaration under Section 564(b)(1) of the Act, 21 U.Aianna Fahs.C.section 360bbb-3(b)(1), unless the authorization is terminated  or revoked sooner.       Influenza A by PCR NEGATIVE NEGATIVE   Influenza B by PCR NEGATIVE NEGATIVE    Comment: (NOTE) The Xpert Xpress SARS-CoV-2/FLU/RSV plus assay is intended as an aid in the diagnosis of influenza from Nasopharyngeal swab specimens and should not be used as a sole basis for treatment. Nasal washings and aspirates are unacceptable for Xpert Xpress SARS-CoV-2/FLU/RSV testing.  Fact Sheet for Patients: EntrepreneurPulse.com.au  Fact Sheet for Healthcare Providers: IncredibleEmployment.be  This test is not yet approved or cleared by the [place] FDA and has been authorized for detection and/or diagnosis of SARS-CoV-2 by FDA under an Emergency Use Authorization (EUA). This EUA will remain in effect (meaning this test can be used) for the duration of the COVID-19 declaration under Section 564(b)(1) of the Act, 21 U.Evart Mcdonnell.C. section 360bbb-3(b)(1), unless the authorization is terminated or revoked.     Resp Syncytial Virus by PCR NEGATIVE NEGATIVE    Comment: (NOTE) Fact Sheet for Patients: EntrepreneurPulse.com.au  Fact Sheet for Healthcare Providers: IncredibleEmployment.be  This test is not yet approved or  cleared by the [place] FDA and has been authorized for detection and/or diagnosis of SARS-CoV-2 by FDA under an Emergency Use Authorization (EUA). This EUA will remain in effect (meaning this test can be used) for the duration of the COVID-19 declaration under Section 564(b)(1) of the Act, 21 U.Chantal Worthey.C. section 360bbb-3(b)(1), unless the authorization is terminated or revoked.  Performed at [facility], [address] [place], [place] [address]   Group A Strep by PCR [facility] Only)     Status: None   Collection Time: 09/13/22 11:17 PM   Specimen: Anterior Nasal Swab; Sterile Swab  Result Value Ref Range   Group A Strep by PCR NOT DETECTED NOT DETECTED    Comment: Performed at [facility], [address] [place], [place] [address]  POCT CBG (Fasting - Glucose)     Status: Abnormal   Collection Time: 10/13/22  2:36 PM  Result Value Ref Range   Glucose Fasting, POC 128 (A) 70 - 99 mg/dL  CMP14+EGFR     Status: Abnormal   Collection Time: 11/13/22 11:09 AM  Result Value Ref Range   Glucose 126 (H) 70 - 99 mg/dL   BUN 13 8 - 27 mg/dL   Creatinine, Ser 0.94 0.76 - 1.27 mg/dL   eGFR 93 >59 mL/min/1.73   BUN/Creatinine Ratio 14 10 - 24   Sodium 140 134 - 144 mmol/L   Potassium 4.3 3.5 - 5.2 mmol/L   Chloride 100 96 - 106 mmol/L   CO2 23 20 - 29 mmol/L   Calcium 9.3 8.6 - 10.2 mg/dL   Total Protein 7.1 6.0 - 8.5 g/dL   Albumin 4.2 3.8 - 4.9 g/dL   Globulin, Total 2.9 1.5 - 4.5 g/dL   Albumin/Globulin Ratio 1.4 1.2 - 2.2   Bilirubin Total 0.5 0.0 - 1.2 mg/dL   Alkaline Phosphatase 65 44 - 121 IU/L   AST 13 0 - 40 IU/L  ALT 18 0 - 44 IU/L  Lipid Panel w/o Chol/HDL Ratio     Status: Abnormal   Collection Time: 11/13/22 11:09 AM  Result Value Ref Range   Cholesterol, Total 232 (H) 100 - 199 mg/dL   Triglycerides 285 (H) 0 - 149 mg/dL   HDL 36 (L) >39 mg/dL   VLDL Cholesterol Cal 52 (H) 5 - 40 mg/dL   LDL Chol Calc (NIH) 144 (H) 0 - 99 mg/dL  Hgb A1c w/o  eAG     Status: Abnormal   Collection Time: 11/13/22 11:09 AM  Result Value Ref Range   Hgb A1c MFr Bld 7.6 (H) 4.8 - 5.6 %    Comment:          Prediabetes: 5.7 - 6.4          Diabetes: >6.4          Glycemic control for adults with diabetes: <7.0   POCT Glucose (CBG)     Status: Abnormal   Collection Time: 11/17/22 10:28 AM  Result Value Ref Range   POC Glucose 117 (A) 70 - 99 mg/dl      Assessment & Plan:   Problem List Items Addressed This Visit       Cardiovascular and Mediastinum   Essential hypertension   Relevant Medications   furosemide (LASIX) 20 MG tablet   Chronic venous insufficiency   Relevant Medications   furosemide (LASIX) 20 MG tablet     Endocrine   Uncontrolled type 2 diabetes mellitus with hyperglycemia, without long-term current use of insulin (HCC)   Relevant Medications   dapagliflozin propanediol (FARXIGA) 10 MG TABS tablet   Semaglutide,0.25 or 0.5MG /DOS, 2 MG/3ML SOPN   Other Relevant Orders   Fructosamine     Other   Hyperlipidemia   Relevant Medications   furosemide (LASIX) 20 MG tablet   Class 2 severe obesity due to excess calories with serious comorbidity and body mass index (BMI) of 36.0 to 36.9 in adult [facility])   Relevant Medications   dapagliflozin propanediol (FARXIGA) 10 MG TABS tablet   Semaglutide,0.25 or 0.5MG /DOS, 2 MG/3ML SOPN   Other Visit Diagnoses     Type 2 diabetes mellitus without complication, without long-term current use of insulin (HCC)    -  Primary   Relevant Medications   dapagliflozin propanediol (FARXIGA) 10 MG TABS tablet   Semaglutide,0.25 or 0.5MG /DOS, 2 MG/3ML SOPN   Other Relevant Orders   POCT Glucose (CBG) (Completed)       Return in about 6 weeks (around 12/29/2022) for fu with labs prior.   Total time spent: 30 minutes  Volanda Napoleon, MD  11/17/2022

## 2022-12-07 ENCOUNTER — Other Ambulatory Visit: Payer: Self-pay | Admitting: Internal Medicine

## 2022-12-07 DIAGNOSIS — J329 Chronic sinusitis, unspecified: Secondary | ICD-10-CM

## 2022-12-08 ENCOUNTER — Other Ambulatory Visit: Payer: Self-pay | Admitting: Internal Medicine

## 2022-12-19 ENCOUNTER — Other Ambulatory Visit: Payer: Self-pay | Admitting: Internal Medicine

## 2022-12-19 DIAGNOSIS — E119 Type 2 diabetes mellitus without complications: Secondary | ICD-10-CM

## 2023-01-03 ENCOUNTER — Encounter: Payer: Self-pay | Admitting: Internal Medicine

## 2023-01-03 ENCOUNTER — Ambulatory Visit (INDEPENDENT_AMBULATORY_CARE_PROVIDER_SITE_OTHER): Payer: BC Managed Care – PPO | Admitting: Internal Medicine

## 2023-01-03 VITALS — BP 102/80 | HR 85 | Ht 72.0 in | Wt 292.6 lb

## 2023-01-03 DIAGNOSIS — E782 Mixed hyperlipidemia: Secondary | ICD-10-CM | POA: Diagnosis not present

## 2023-01-03 DIAGNOSIS — Z6836 Body mass index (BMI) 36.0-36.9, adult: Secondary | ICD-10-CM

## 2023-01-03 DIAGNOSIS — I10 Essential (primary) hypertension: Secondary | ICD-10-CM | POA: Diagnosis not present

## 2023-01-03 DIAGNOSIS — E119 Type 2 diabetes mellitus without complications: Secondary | ICD-10-CM

## 2023-01-03 LAB — GLUCOSE, POCT (MANUAL RESULT ENTRY): POC Glucose: 102 mg/dl — AB (ref 70–99)

## 2023-01-03 MED ORDER — ACCU-CHEK GUIDE VI STRP: 1.0000 | ORAL_STRIP | 3 refills | Status: DC | PRN

## 2023-01-03 NOTE — Progress Notes (Signed)
Established Patient Office Visit  Subjective:  Patient ID: Patrick Burns, male    DOB: Oct 05, 1960  Age: 62 y.o. MRN: [id number]  Chief Complaint  Patient presents with   Follow-up    6 week with lab results    No new complaints, here for lab review and medication refills. Failed to have previsit labs done.    No other concerns at this time.   Past Medical History:  Diagnosis Date   Asthma    Blood dyscrasia    "BLOOD IS THICK"   Diabetes mellitus without complication (HCC)    Dyspnea on exertion    GERD (gastroesophageal reflux disease)    Headache    MIGRAINES   Hypertension    Hypogonadism male    Leaky heart valve    Metastatic cancer (HCC) 2006   Rectum --> prostate --> bladder/chemo and radiation/ [facility]/squamous cell carcinoma anal rectum area   Peripheral edema    Sleep apnea    NOT USING CPAP CURRENTLY   Umbilical hernia     Past Surgical History:  Procedure Laterality Date   COLONOSCOPY  2011   [facility]   KNEE SURGERY     TONSILLECTOMY     UMBILICAL HERNIA REPAIR N/A 05/06/2015   Procedure: HERNIA REPAIR UMBILICAL ADULT;  Surgeon: Kieth Brightly, MD;  Location: [facility] ORS;  Service: General;  Laterality: N/A;    Social History   Socioeconomic History   Marital status: Married    Spouse name: Not on file   Number of children: Not on file   Years of education: Not on file   Highest education level: Not on file  Occupational History   Not on file  Tobacco Use   Smoking status: Never   Smokeless tobacco: Current    Types: Snuff   Tobacco comments:    smokelss x 44 years (1 can every other day)  Substance and Sexual Activity   Alcohol use: Not Currently    Alcohol/week: 0.0 standard drinks of alcohol    Comment: OCC SIP MOONSHINE   Drug use: No   Sexual activity: Not on file  Other Topics Concern   Not on file  Social History Narrative   Not on file   Social Determinants of Health   Financial Resource Strain: Not on file  Food  Insecurity: Not on file  Transportation Needs: Not on file  Physical Activity: Not on file  Stress: Not on file  Social Connections: Not on file  Intimate Partner Violence: Not on file    Family History  Problem Relation Age of Onset   Diabetes Father    Diabetes Brother     Allergies  Allergen Reactions   Bee Venom Anaphylaxis   Ibuprofen Anaphylaxis   Biaxin [Clarithromycin] Nausea And Vomiting   Mango Flavor Swelling    LIPS   Metformin And Related Diarrhea    Review of Systems  Constitutional: Negative.        Gained 2 lb weight  HENT: Negative.    Eyes: Negative.   Respiratory: Negative.    Cardiovascular: Negative.   Gastrointestinal: Negative.   Genitourinary:  Positive for frequency.  Skin: Negative.   Neurological: Negative.   Endo/Heme/Allergies:  Positive for polydipsia.       Objective:   BP 102/80   Pulse 85   Ht 6' (1.829 m)   Wt 292 lb 9.6 oz (132.7 kg)   SpO2 91%   BMI 39.68 kg/m   Vitals:  01/03/23 1110  BP: 102/80  Pulse: 85  Height: 6' (1.829 m)  Weight: 292 lb 9.6 oz (132.7 kg)  SpO2: 91%  BMI (Calculated): 39.67    Physical Exam Vitals reviewed.  Constitutional:      Appearance: Normal appearance. He is obese.  HENT:     Head: Normocephalic.     Left Ear: There is no impacted cerumen.     Nose: Nose normal.     Mouth/Throat:     Mouth: Mucous membranes are moist.     Pharynx: No posterior oropharyngeal erythema.  Eyes:     Extraocular Movements: Extraocular movements intact.     Pupils: Pupils are equal, round, and reactive to light.  Cardiovascular:     Rate and Rhythm: Regular rhythm.     Chest Wall: PMI is not displaced.     Pulses: Normal pulses.     Heart sounds: Normal heart sounds. No murmur heard. Pulmonary:     Effort: Pulmonary effort is normal.     Breath sounds: Normal air entry. No rhonchi or rales.  Abdominal:     General: Abdomen is flat. Bowel sounds are normal. There is no distension.      Palpations: Abdomen is soft. There is no hepatomegaly, splenomegaly or mass.     Tenderness: There is no abdominal tenderness.  Musculoskeletal:        General: Normal range of motion.     Cervical back: Normal range of motion and neck supple.     Right lower leg: No edema.     Left lower leg: No edema.  Skin:    General: Skin is warm and dry.  Neurological:     General: No focal deficit present.     Mental Status: He is alert and oriented to person, place, and time.     Cranial Nerves: No cranial nerve deficit.     Motor: No weakness.  Psychiatric:        Mood and Affect: Mood normal.        Behavior: Behavior normal.      Results for orders placed or performed in visit on 01/03/23  POCT Glucose (CBG)  Result Value Ref Range   POC Glucose 102 (A) 70 - 99 mg/dl    Recent Results (from the past 2160 hour(Deziray Nabi))  POCT CBG (Fasting - Glucose)     Status: Abnormal   Collection Time: 10/13/22  2:36 PM  Result Value Ref Range   Glucose Fasting, POC 128 (A) 70 - 99 mg/dL  [id number]     Status: Abnormal   Collection Time: 11/13/22 11:09 AM  Result Value Ref Range   Glucose 126 (H) 70 - 99 mg/dL   BUN 13 8 - 27 mg/dL   Creatinine, Ser 4.09 0.76 - 1.27 mg/dL   eGFR 93 >81 XB/JYN/8.29   BUN/Creatinine Ratio 14 10 - 24   Sodium 140 134 - 144 mmol/L   Potassium 4.3 3.5 - 5.2 mmol/L   Chloride 100 96 - 106 mmol/L   CO2 23 20 - 29 mmol/L   Calcium 9.3 8.6 - 10.2 mg/dL   Total Protein 7.1 6.0 - 8.5 g/dL   Albumin 4.2 3.8 - 4.9 g/dL   Globulin, Total 2.9 1.5 - 4.5 g/dL   Albumin/Globulin Ratio 1.4 1.2 - 2.2   Bilirubin Total 0.5 0.0 - 1.2 mg/dL   Alkaline Phosphatase 65 44 - 121 IU/L   AST 13 0 - 40 IU/L   ALT 18 0 - 44 IU/L  Lipid Panel w/o Chol/HDL Ratio     Status: Abnormal   Collection Time: 11/13/22 11:09 AM  Result Value Ref Range   Cholesterol, Total 232 (H) 100 - 199 mg/dL   Triglycerides 409 (H) 0 - 149 mg/dL   HDL 36 (L) >81 mg/dL   VLDL Cholesterol Cal 52 (H) 5 - 40  mg/dL   LDL Chol Calc (NIH) 191 (H) 0 - 99 mg/dL  Hgb Y7W w/o eAG     Status: Abnormal   Collection Time: 11/13/22 11:09 AM  Result Value Ref Range   Hgb A1c MFr Bld 7.6 (H) 4.8 - 5.6 %    Comment:          Prediabetes: 5.7 - 6.4          Diabetes: >6.4          Glycemic control for adults with diabetes: <7.0   POCT Glucose (CBG)     Status: Abnormal   Collection Time: 11/17/22 10:28 AM  Result Value Ref Range   POC Glucose 117 (A) 70 - 99 mg/dl  POCT Glucose (CBG)     Status: Abnormal   Collection Time: 01/03/23 11:20 AM  Result Value Ref Range   POC Glucose 102 (A) 70 - 99 mg/dl      Assessment & Plan:   Problem List Items Addressed This Visit   None Visit Diagnoses     Type 2 diabetes mellitus without complication, without long-term current use of insulin (HCC)    -  Primary   Relevant Orders   POCT Glucose (CBG) (Completed)       No follow-ups on file.   Total time spent: 20 minutes  Luna Fuse, MD  01/03/2023

## 2023-01-04 LAB — FRUCTOSAMINE: Fructosamine: 227 umol/L (ref 0–285)

## 2023-01-07 ENCOUNTER — Other Ambulatory Visit: Payer: Self-pay | Admitting: Internal Medicine

## 2023-01-07 DIAGNOSIS — K219 Gastro-esophageal reflux disease without esophagitis: Secondary | ICD-10-CM

## 2023-01-09 ENCOUNTER — Other Ambulatory Visit: Payer: Self-pay | Admitting: Nurse Practitioner

## 2023-01-09 ENCOUNTER — Other Ambulatory Visit: Payer: Self-pay | Admitting: Internal Medicine

## 2023-01-09 DIAGNOSIS — I10 Essential (primary) hypertension: Secondary | ICD-10-CM

## 2023-01-09 DIAGNOSIS — J329 Chronic sinusitis, unspecified: Secondary | ICD-10-CM

## 2023-01-13 ENCOUNTER — Other Ambulatory Visit: Payer: Self-pay | Admitting: Internal Medicine

## 2023-01-13 DIAGNOSIS — E119 Type 2 diabetes mellitus without complications: Secondary | ICD-10-CM

## 2023-01-13 DIAGNOSIS — E1165 Type 2 diabetes mellitus with hyperglycemia: Secondary | ICD-10-CM

## 2023-02-09 ENCOUNTER — Other Ambulatory Visit: Payer: Self-pay | Admitting: Internal Medicine

## 2023-02-09 DIAGNOSIS — J45998 Other asthma: Secondary | ICD-10-CM

## 2023-02-13 ENCOUNTER — Other Ambulatory Visit: Payer: Self-pay | Admitting: Internal Medicine

## 2023-02-13 ENCOUNTER — Telehealth: Payer: Self-pay

## 2023-02-13 ENCOUNTER — Other Ambulatory Visit: Payer: Self-pay | Admitting: Cardiovascular Disease

## 2023-02-13 MED ORDER — METOPROLOL TARTRATE 25 MG PO TABS: 25.0000 mg | ORAL_TABLET | Freq: Two times a day (BID) | ORAL | 0 refills | Status: DC

## 2023-02-13 NOTE — Telephone Encounter (Signed)
LM for pt

## 2023-02-13 NOTE — Telephone Encounter (Signed)
Patient called stating that he left half his bottle of beta blockers at home and will be in [place] for the next two weeks, he is wondering what he should do or if he would be okay to just take one  a day instead of two a day until he gets home

## 2023-02-15 ENCOUNTER — Telehealth: Payer: Self-pay | Admitting: Internal Medicine

## 2023-02-15 NOTE — Telephone Encounter (Signed)
Patient left VM that he needs his beta blocker sent to [facility] [place]. [place] Spaniel he is out of town and didn't pack enough. Need to call and make sure he told us the correct pharmacy if he is out of town.

## 2023-02-27 ENCOUNTER — Other Ambulatory Visit: Payer: Self-pay | Admitting: Internal Medicine

## 2023-03-04 ENCOUNTER — Other Ambulatory Visit: Payer: Self-pay | Admitting: Internal Medicine

## 2023-03-04 DIAGNOSIS — I10 Essential (primary) hypertension: Secondary | ICD-10-CM

## 2023-04-02 ENCOUNTER — Other Ambulatory Visit: Payer: BC Managed Care – PPO

## 2023-04-02 DIAGNOSIS — E1165 Type 2 diabetes mellitus with hyperglycemia: Secondary | ICD-10-CM

## 2023-04-02 DIAGNOSIS — I10 Essential (primary) hypertension: Secondary | ICD-10-CM

## 2023-04-06 ENCOUNTER — Ambulatory Visit (INDEPENDENT_AMBULATORY_CARE_PROVIDER_SITE_OTHER): Payer: BC Managed Care – PPO | Admitting: Internal Medicine

## 2023-04-06 ENCOUNTER — Encounter: Payer: Self-pay | Admitting: Internal Medicine

## 2023-04-06 VITALS — BP 110/79 | HR 80 | Ht 72.0 in | Wt 294.4 lb

## 2023-04-06 DIAGNOSIS — Z0001 Encounter for general adult medical examination with abnormal findings: Secondary | ICD-10-CM | POA: Diagnosis not present

## 2023-04-06 DIAGNOSIS — E119 Type 2 diabetes mellitus without complications: Secondary | ICD-10-CM | POA: Diagnosis not present

## 2023-04-06 DIAGNOSIS — K219 Gastro-esophageal reflux disease without esophagitis: Secondary | ICD-10-CM | POA: Diagnosis not present

## 2023-04-06 DIAGNOSIS — Z1331 Encounter for screening for depression: Secondary | ICD-10-CM

## 2023-04-06 LAB — POCT CBG (FASTING - GLUCOSE)-MANUAL ENTRY: Glucose Fasting, POC: 105 mg/dL — AB (ref 70–99)

## 2023-04-06 MED ORDER — FAMOTIDINE 20 MG PO TABS
20.0000 mg | ORAL_TABLET | Freq: Two times a day (BID) | ORAL | 0 refills | Status: DC
Start: 2023-04-06 — End: 2023-07-02

## 2023-04-06 MED ORDER — APIXABAN 5 MG PO TABS: 5.0000 mg | ORAL_TABLET | Freq: Two times a day (BID) | ORAL | 0 refills | Status: DC

## 2023-04-06 MED ORDER — DAPAGLIFLOZIN PROPANEDIOL 10 MG PO TABS: 10.0000 mg | ORAL_TABLET | Freq: Every day | ORAL | 0 refills | Status: AC

## 2023-04-06 MED ORDER — SEMAGLUTIDE (1 MG/DOSE) 4 MG/3ML ~~LOC~~ SOPN: 1.0000 mg | PEN_INJECTOR | SUBCUTANEOUS | 0 refills | Status: DC

## 2023-04-06 NOTE — Progress Notes (Signed)
Established Patient Office Visit  Subjective:  Patient ID: Patrick Burns, male    DOB: 1960-09-27  Age: 62 y.o. MRN: [id number]  Chief Complaint  Patient presents with   Annual Exam    CPE with labs     Here for CPE, lab review and medication refills. Labs reviewed and notable for well controlled/uncontrolled diabetes, A1c not / at target, lipids at target with unremarkable cmp. Denies any hypoglycemic episodes and home bg readings have been at target.  Weight loss and reports mild constipation with Ozempic. Also c/o hearing loss.    No other concerns at this time.   Past Medical History:  Diagnosis Date   Asthma    Blood dyscrasia    "BLOOD IS THICK"   Diabetes mellitus without complication (HCC)    Dyspnea on exertion    GERD (gastroesophageal reflux disease)    Headache    MIGRAINES   Hypertension    Hypogonadism male    Leaky heart valve    Metastatic cancer (HCC) 2006   Rectum --> prostate --> bladder/chemo and radiation/ [facility]/squamous cell carcinoma anal rectum area   Peripheral edema    Sleep apnea    NOT USING CPAP CURRENTLY   Umbilical hernia     Past Surgical History:  Procedure Laterality Date   COLONOSCOPY  2011   [facility]   KNEE SURGERY     TONSILLECTOMY     UMBILICAL HERNIA REPAIR N/A 05/06/2015   Procedure: HERNIA REPAIR UMBILICAL ADULT;  Surgeon: Kieth Brightly, MD;  Location: [facility] ORS;  Service: General;  Laterality: N/A;    Social History   Socioeconomic History   Marital status: Married    Spouse name: Not on file   Number of children: Not on file   Years of education: Not on file   Highest education level: Not on file  Occupational History   Not on file  Tobacco Use   Smoking status: Never   Smokeless tobacco: Current    Types: Snuff   Tobacco comments:    smokelss x 44 years (1 can every other day)  Substance and Sexual Activity   Alcohol use: Not Currently    Alcohol/week: 0.0 standard drinks of alcohol    Comment:  OCC SIP MOONSHINE   Drug use: No   Sexual activity: Not on file  Other Topics Concern   Not on file  Social History Narrative   Not on file   Social Determinants of Health   Financial Resource Strain: Not on file  Food Insecurity: Not on file  Transportation Needs: Not on file  Physical Activity: Not on file  Stress: Not on file  Social Connections: Not on file  Intimate Partner Violence: Not on file    Family History  Problem Relation Age of Onset   Diabetes Father    Diabetes Brother     Allergies  Allergen Reactions   Bee Venom Anaphylaxis   Ibuprofen Anaphylaxis   Biaxin [Clarithromycin] Nausea And Vomiting   Mango Flavor Swelling    LIPS   Metformin And Related Diarrhea    Review of Systems  Constitutional: Negative.        Gained 2 lb weight  HENT: Negative.    Eyes: Negative.   Respiratory: Negative.    Cardiovascular: Negative.   Gastrointestinal:  Positive for constipation.  Genitourinary:  Positive for frequency.  Skin: Negative.   Neurological: Negative.   Endo/Heme/Allergies:  Positive for polydipsia.  Objective:   BP 110/79   Pulse 80   Ht 6' (1.829 m)   Wt 294 lb 6.4 oz (133.5 kg)   SpO2 93%   BMI 39.93 kg/m   Vitals:   04/06/23 0940  BP: 110/79  Pulse: 80  Height: 6' (1.829 m)  Weight: 294 lb 6.4 oz (133.5 kg)  SpO2: 93%  BMI (Calculated): 39.92    Physical Exam Vitals reviewed.  Constitutional:      Appearance: Normal appearance. He is obese.  HENT:     Head: Normocephalic.     Left Ear: There is no impacted cerumen.     Nose: Nose normal.     Mouth/Throat:     Mouth: Mucous membranes are moist.     Pharynx: No posterior oropharyngeal erythema.  Eyes:     Extraocular Movements: Extraocular movements intact.     Pupils: Pupils are equal, round, and reactive to light.  Cardiovascular:     Rate and Rhythm: Regular rhythm.     Chest Wall: PMI is not displaced.     Pulses: Normal pulses.     Heart sounds: Normal  heart sounds. No murmur heard. Pulmonary:     Effort: Pulmonary effort is normal.     Breath sounds: Normal air entry. No rhonchi or rales.  Abdominal:     General: Abdomen is flat. Bowel sounds are normal. There is no distension.     Palpations: Abdomen is soft. There is no hepatomegaly, splenomegaly or mass.     Tenderness: There is no abdominal tenderness.  Musculoskeletal:        General: Normal range of motion.     Cervical back: Normal range of motion and neck supple.     Right lower leg: No edema.     Left lower leg: No edema.  Skin:    General: Skin is warm and dry.  Neurological:     General: No focal deficit present.     Mental Status: He is alert and oriented to person, place, and time.     Cranial Nerves: No cranial nerve deficit.     Motor: No weakness.  Psychiatric:        Mood and Affect: Mood normal.        Behavior: Behavior normal.      Results for orders placed or performed in visit on 04/06/23  POCT CBG (Fasting - Glucose)  Result Value Ref Range   Glucose Fasting, POC 105 (A) 70 - 99 mg/dL    Recent Results (from the past 2160 hour(Aleathea Pugmire))  CBC With Diff/Platelet     Status: None   Collection Time: 04/02/23  2:01 PM  Result Value Ref Range   WBC 7.1 3.4 - 10.8 x10E3/uL   RBC 5.46 4.14 - 5.80 x10E6/uL   Hemoglobin 15.7 13.0 - 17.7 g/dL   Hematocrit 33.2 95.1 - 51.0 %   MCV 86 79 - 97 fL   MCH 28.8 26.6 - 33.0 pg   MCHC 33.3 31.5 - 35.7 g/dL   RDW 88.4 16.6 - 06.3 %   Platelets 169 150 - 450 x10E3/uL   Neutrophils 61 Not Estab. %   Lymphs 23 Not Estab. %   Monocytes 9 Not Estab. %   Eos 6 Not Estab. %   Basos 1 Not Estab. %   Neutrophils Absolute 4.4 1.4 - 7.0 x10E3/uL   Lymphocytes Absolute 1.6 0.7 - 3.1 x10E3/uL   Monocytes Absolute 0.6 0.1 - 0.9 x10E3/uL   EOS (ABSOLUTE) 0.4 0.0 -  0.4 x10E3/uL   Basophils Absolute 0.1 0.0 - 0.2 x10E3/uL   Immature Granulocytes 0 Not Estab. %   Immature Grans (Abs) 0.0 0.0 - 0.1 x10E3/uL  Hemoglobin A1c      Status: Abnormal   Collection Time: 04/02/23  2:01 PM  Result Value Ref Range   Hgb A1c MFr Bld 6.0 (H) 4.8 - 5.6 %    Comment:          Prediabetes: 5.7 - 6.4          Diabetes: >6.4          Glycemic control for adults with diabetes: <7.0    Est. average glucose Bld gHb Est-mCnc 126 mg/dL  Comprehensive metabolic panel     Status: None   Collection Time: 04/02/23  2:01 PM  Result Value Ref Range   Glucose 95 70 - 99 mg/dL   BUN 17 8 - 27 mg/dL   Creatinine, Ser 4.09 0.76 - 1.27 mg/dL   eGFR 94 >81 XB/JYN/8.29   BUN/Creatinine Ratio 18 10 - 24   Sodium 140 134 - 144 mmol/L   Potassium 4.4 3.5 - 5.2 mmol/L   Chloride 102 96 - 106 mmol/L   CO2 24 20 - 29 mmol/L   Calcium 8.9 8.6 - 10.2 mg/dL   Total Protein 6.6 6.0 - 8.5 g/dL   Albumin 4.2 3.8 - 4.9 g/dL   Globulin, Total 2.4 1.5 - 4.5 g/dL   Bilirubin Total 0.5 0.0 - 1.2 mg/dL   Alkaline Phosphatase 55 44 - 121 IU/L   AST 13 0 - 40 IU/L   ALT 13 0 - 44 IU/L  Lipid panel     Status: Abnormal   Collection Time: 04/02/23  2:01 PM  Result Value Ref Range   Cholesterol, Total 137 100 - 199 mg/dL   Triglycerides 562 (H) 0 - 149 mg/dL   HDL 27 (L) >13 mg/dL   VLDL Cholesterol Cal 45 (H) 5 - 40 mg/dL   LDL Chol Calc (NIH) 65 0 - 99 mg/dL   Chol/HDL Ratio 5.1 (H) 0.0 - 5.0 ratio    Comment:                                   T. Chol/HDL Ratio                                             Men  Women                               1/2 Avg.Risk  3.4    3.3                                   Avg.Risk  5.0    4.4                                2X Avg.Risk  9.6    7.1                                3X Avg.Risk 23.4  11.0   POCT CBG (Fasting - Glucose)     Status: Abnormal   Collection Time: 04/06/23  9:46 AM  Result Value Ref Range   Glucose Fasting, POC 105 (A) 70 - 99 mg/dL      Assessment & Plan:  As per problem list  Problem List Items Addressed This Visit   None Visit Diagnoses     Type 2 diabetes mellitus without  complication, without long-term current use of insulin (HCC)    -  Primary   Relevant Medications   dapagliflozin propanediol (FARXIGA) 10 MG TABS tablet   Semaglutide, 1 MG/DOSE, 4 MG/3ML SOPN   Other Relevant Orders   POCT CBG (Fasting - Glucose) (Completed)   Type 2 diabetes mellitus without complications (HCC)       Relevant Medications   dapagliflozin propanediol (FARXIGA) 10 MG TABS tablet   Semaglutide, 1 MG/DOSE, 4 MG/3ML SOPN   Other Relevant Orders   POCT CBG (Fasting - Glucose) (Completed)   Gastro-esophageal reflux disease without esophagitis       Relevant Medications   famotidine (PEPCID) 20 MG tablet       Return in about 3 months (around 07/07/2023) for fu with labs prior.   Total time spent: 40 minutes  Luna Fuse, MD  04/06/2023   This document may have been prepared by [facility] Voice Recognition software and as such may include unintentional dictation errors.

## 2023-04-27 ENCOUNTER — Telehealth: Payer: Self-pay

## 2023-04-27 NOTE — Telephone Encounter (Signed)
Patient called stating that  since increasing his dose of ozempic he's has loose BMs to the point that he has to change his clothes

## 2023-05-01 NOTE — Telephone Encounter (Signed)
Pt informed that he just started the 1 mg and his 2nd dose so far has not caused him to have loose BM. Pt was informed if it does start up again since pt just started dose to reach back out to office to ensure provider does not want to change pt dosage.

## 2023-05-02 ENCOUNTER — Other Ambulatory Visit: Payer: Self-pay | Admitting: Cardiovascular Disease

## 2023-05-03 ENCOUNTER — Other Ambulatory Visit: Payer: Self-pay | Admitting: Internal Medicine

## 2023-05-29 ENCOUNTER — Other Ambulatory Visit: Payer: Self-pay | Admitting: Internal Medicine

## 2023-06-05 ENCOUNTER — Ambulatory Visit
Admission: RE | Admit: 2023-06-05 | Discharge: 2023-06-05 | Disposition: A | Discharge status: Home / Self Care | Payer: BC Managed Care – PPO | Attending: Cardiology | Admitting: Cardiology

## 2023-06-05 ENCOUNTER — Encounter: Payer: Self-pay | Admitting: Cardiology

## 2023-06-05 ENCOUNTER — Ambulatory Visit
Admission: RE | Admit: 2023-06-05 | Discharge: 2023-06-05 | Disposition: A | Discharge status: Home / Self Care | Payer: BC Managed Care – PPO | Source: Ambulatory Visit | Attending: Cardiology | Admitting: Cardiology

## 2023-06-05 ENCOUNTER — Ambulatory Visit (INDEPENDENT_AMBULATORY_CARE_PROVIDER_SITE_OTHER): Payer: BC Managed Care – PPO | Admitting: Cardiology

## 2023-06-05 VITALS — BP 90/70 | HR 71 | Ht 72.0 in | Wt 287.0 lb

## 2023-06-05 DIAGNOSIS — R051 Acute cough: Secondary | ICD-10-CM | POA: Insufficient documentation

## 2023-06-05 DIAGNOSIS — J45998 Other asthma: Secondary | ICD-10-CM | POA: Insufficient documentation

## 2023-06-05 DIAGNOSIS — E119 Type 2 diabetes mellitus without complications: Secondary | ICD-10-CM

## 2023-06-05 LAB — POCT CBG (FASTING - GLUCOSE)-MANUAL ENTRY: Glucose Fasting, POC: 86 mg/dL (ref 70–99)

## 2023-06-05 MED ORDER — BENZONATATE 100 MG PO CAPS
100.0000 mg | ORAL_CAPSULE | Freq: Three times a day (TID) | ORAL | 1 refills | Status: AC | PRN
Start: 2023-06-05 — End: 2024-06-04

## 2023-06-05 MED ORDER — LEVOFLOXACIN 500 MG PO TABS
500.0000 mg | ORAL_TABLET | Freq: Every day | ORAL | 0 refills | Status: AC
Start: 2023-06-05 — End: 2023-06-12

## 2023-06-05 MED ORDER — ALBUTEROL SULFATE HFA 108 (90 BASE) MCG/ACT IN AERS: 2.0000 | INHALATION_SPRAY | Freq: Four times a day (QID) | RESPIRATORY_TRACT | 5 refills | Status: AC | PRN

## 2023-06-05 NOTE — Progress Notes (Unsigned)
Established Patient Office Visit  Subjective:  Patient ID: Patrick Burns, male    DOB: [birth year]  Age: 62 y.o. MRN: [id number]  Chief Complaint  Patient presents with   Follow-up    Possible bronchitis    Patient in office concerned for possible bronchitis. Patient reports symptoms started 2-3 days ago. Patient complaining of a productive cough, green per patient. Patient also complaining of a runny nose, nasal congestion, and headaches. Denies smoking. Abnormal lung sounds. Will order a chest xray. Levofloxacin sent to the pharmacy. Tessalon pearls and albuterol inhaler sent in. Patient has an appointment with PCP on 07/13/23, will keep that follow up.   Cough This is a new problem. The current episode started in the past 7 days. The problem has been unchanged. The problem occurs every few minutes. The cough is Productive of sputum. Associated symptoms include headaches, nasal congestion, rhinorrhea and wheezing. Pertinent negatives include no chest pain, ear congestion, ear pain, fever, myalgias, postnasal drip, sore throat or shortness of breath. The symptoms are aggravated by lying down. He has tried body position changes (Mucinex, Delsym) for the symptoms. The treatment provided no relief. His past medical history is significant for environmental allergies.    No other concerns at this time.   Past Medical History:  Diagnosis Date   Asthma    Blood dyscrasia    "BLOOD IS THICK"   Diabetes mellitus without complication (HCC)    Dyspnea on exertion    GERD (gastroesophageal reflux disease)    Headache    MIGRAINES   Hypertension    Hypogonadism male    Leaky heart valve    Metastatic cancer (HCC) 2006   Rectum --> prostate --> bladder/chemo and radiation/ [facility]/squamous cell carcinoma anal rectum area   Peripheral edema    Sleep apnea    NOT USING CPAP CURRENTLY   Umbilical hernia     Past Surgical History:  Procedure Laterality Date   COLONOSCOPY  2011   [facility]    KNEE SURGERY     TONSILLECTOMY     UMBILICAL HERNIA REPAIR N/A 05/06/2015   Procedure: HERNIA REPAIR UMBILICAL ADULT;  Surgeon: Kieth Brightly, MD;  Location: [facility] ORS;  Service: General;  Laterality: N/A;    Social History   Socioeconomic History   Marital status: Married    Spouse name: Not on file   Number of children: Not on file   Years of education: Not on file   Highest education level: Not on file  Occupational History   Not on file  Tobacco Use   Smoking status: Never   Smokeless tobacco: Current    Types: Snuff   Tobacco comments:    smokelss x 44 years (1 can every other day)  Substance and Sexual Activity   Alcohol use: Not Currently    Alcohol/week: 0.0 standard drinks of alcohol    Comment: OCC SIP MOONSHINE   Drug use: No   Sexual activity: Not on file  Other Topics Concern   Not on file  Social History Narrative   Not on file   Social Determinants of Health   Financial Resource Strain: Not on file  Food Insecurity: Not on file  Transportation Needs: Not on file  Physical Activity: Not on file  Stress: Not on file  Social Connections: Not on file  Intimate Partner Violence: Not on file    Family History  Problem Relation Age of Onset   Diabetes Father    Diabetes Brother  Allergies  Allergen Reactions   Bee Venom Anaphylaxis   Ibuprofen Anaphylaxis   Biaxin [Clarithromycin] Nausea And Vomiting   Mango Flavor Swelling    LIPS   Metformin And Related Diarrhea    Review of Systems  Constitutional: Negative.  Negative for fever.  HENT:  Positive for congestion and rhinorrhea. Negative for ear discharge, ear pain, postnasal drip and sore throat.   Eyes: Negative.   Respiratory:  Positive for cough, sputum production and wheezing. Negative for shortness of breath.   Cardiovascular: Negative.  Negative for chest pain.  Gastrointestinal: Negative.  Negative for abdominal pain, constipation and diarrhea.  Genitourinary: Negative.    Musculoskeletal:  Negative for joint pain and myalgias.  Skin: Negative.   Neurological:  Positive for headaches. Negative for dizziness.  Endo/Heme/Allergies:  Positive for environmental allergies.  All other systems reviewed and are negative.      Objective:   BP 90/70   Pulse 71   Ht 6' (1.829 m)   Wt 287 lb (130.2 kg)   SpO2 97%   BMI 38.92 kg/m   Vitals:   06/05/23 1514  BP: 90/70  Pulse: 71  Height: 6' (1.829 m)  Weight: 287 lb (130.2 kg)  SpO2: 97%  BMI (Calculated): 38.92    Physical Exam Nursing note reviewed.  Constitutional:      Appearance: Normal appearance. He is normal weight.  HENT:     Head: Normocephalic and atraumatic.     Nose: Nose normal.     Mouth/Throat:     Mouth: Mucous membranes are moist.     Pharynx: Oropharynx is clear.  Eyes:     Extraocular Movements: Extraocular movements intact.     Conjunctiva/sclera: Conjunctivae normal.     Pupils: Pupils are equal, round, and reactive to light.  Cardiovascular:     Rate and Rhythm: Normal rate and regular rhythm.     Pulses: Normal pulses.     Heart sounds: Normal heart sounds.  Pulmonary:     Effort: Pulmonary effort is normal.     Breath sounds: Wheezing and rhonchi present.  Abdominal:     General: Abdomen is flat. Bowel sounds are normal.     Palpations: Abdomen is soft.  Musculoskeletal:        General: Normal range of motion.     Cervical back: Normal range of motion.  Skin:    General: Skin is warm and dry.  Neurological:     General: No focal deficit present.     Mental Status: He is alert and oriented to person, place, and time.  Psychiatric:        Mood and Affect: Mood normal.        Behavior: Behavior normal.        Thought Content: Thought content normal.        Judgment: Judgment normal.      Results for orders placed or performed in visit on 06/05/23  POCT CBG (Fasting - Glucose)  Result Value Ref Range   Glucose Fasting, POC 86 70 - 99 mg/dL    Recent  Results (from the past 2160 hour(s))  CBC With Diff/Platelet     Status: None   Collection Time: 04/02/23  2:01 PM  Result Value Ref Range   WBC 7.1 3.4 - 10.8 x10E3/uL   RBC 5.46 4.14 - 5.80 x10E6/uL   Hemoglobin 15.7 13.0 - 17.7 g/dL   Hematocrit 09.8 11.9 - 51.0 %   MCV 86 79 - 97 fL  MCH 28.8 26.6 - 33.0 pg   MCHC 33.3 31.5 - 35.7 g/dL   RDW 10.6 26.9 - 48.5 %   Platelets 169 150 - 450 x10E3/uL   Neutrophils 61 Not Estab. %   Lymphs 23 Not Estab. %   Monocytes 9 Not Estab. %   Eos 6 Not Estab. %   Basos 1 Not Estab. %   Neutrophils Absolute 4.4 1.4 - 7.0 x10E3/uL   Lymphocytes Absolute 1.6 0.7 - 3.1 x10E3/uL   Monocytes Absolute 0.6 0.1 - 0.9 x10E3/uL   EOS (ABSOLUTE) 0.4 0.0 - 0.4 x10E3/uL   Basophils Absolute 0.1 0.0 - 0.2 x10E3/uL   Immature Granulocytes 0 Not Estab. %   Immature Grans (Abs) 0.0 0.0 - 0.1 x10E3/uL  Hemoglobin A1c     Status: Abnormal   Collection Time: 04/02/23  2:01 PM  Result Value Ref Range   Hgb A1c MFr Bld 6.0 (H) 4.8 - 5.6 %    Comment:          Prediabetes: 5.7 - 6.4          Diabetes: >6.4          Glycemic control for adults with diabetes: <7.0    Est. average glucose Bld gHb Est-mCnc 126 mg/dL  Comprehensive metabolic panel     Status: None   Collection Time: 04/02/23  2:01 PM  Result Value Ref Range   Glucose 95 70 - 99 mg/dL   BUN 17 8 - 27 mg/dL   Creatinine, Ser 4.62 0.76 - 1.27 mg/dL   eGFR 94 >70 JJ/KKX/3.81   BUN/Creatinine Ratio 18 10 - 24   Sodium 140 134 - 144 mmol/L   Potassium 4.4 3.5 - 5.2 mmol/L   Chloride 102 96 - 106 mmol/L   CO2 24 20 - 29 mmol/L   Calcium 8.9 8.6 - 10.2 mg/dL   Total Protein 6.6 6.0 - 8.5 g/dL   Albumin 4.2 3.8 - 4.9 g/dL   Globulin, Total 2.4 1.5 - 4.5 g/dL   Bilirubin Total 0.5 0.0 - 1.2 mg/dL   Alkaline Phosphatase 55 44 - 121 IU/L   AST 13 0 - 40 IU/L   ALT 13 0 - 44 IU/L  Lipid panel     Status: Abnormal   Collection Time: 04/02/23  2:01 PM  Result Value Ref Range   Cholesterol, Total  137 100 - 199 mg/dL   Triglycerides 829 (H) 0 - 149 mg/dL   HDL 27 (L) >93 mg/dL   VLDL Cholesterol Cal 45 (H) 5 - 40 mg/dL   LDL Chol Calc (NIH) 65 0 - 99 mg/dL   Chol/HDL Ratio 5.1 (H) 0.0 - 5.0 ratio    Comment:                                   T. Chol/HDL Ratio                                             Men  Women                               1/2 Avg.Risk  3.4    3.3  Avg.Risk  5.0    4.4                                2X Avg.Risk  9.6    7.1                                3X Avg.Risk 23.4   11.0   POCT CBG (Fasting - Glucose)     Status: Abnormal   Collection Time: 04/06/23  9:46 AM  Result Value Ref Range   Glucose Fasting, POC 105 (A) 70 - 99 mg/dL  POCT CBG (Fasting - Glucose)     Status: None   Collection Time: 06/05/23  3:19 PM  Result Value Ref Range   Glucose Fasting, POC 86 70 - 99 mg/dL      Assessment & Plan:  Levofloxacin, tessalon pearls, albuterol.  Chest xray today.   Problem List Items Addressed This Visit       Respiratory   Other asthma   Relevant Medications   albuterol (VENTOLIN HFA) 108 (90 Base) MCG/ACT inhaler   Other Relevant Orders   DG Chest 2 View     Other   Acute cough   Relevant Orders   DG Chest 2 View   Other Visit Diagnoses     Type 2 diabetes mellitus without complication, without long-term current use of insulin (HCC)    -  Primary   Relevant Orders   POCT CBG (Fasting - Glucose) (Completed)       Return in about 4 weeks (around 07/03/2023).   Total time spent: 25 minutes  Google, NP  06/05/2023   This document may have been prepared by Dragon Voice Recognition software and as such may include unintentional dictation errors.

## 2023-06-06 DIAGNOSIS — J45998 Other asthma: Secondary | ICD-10-CM | POA: Insufficient documentation

## 2023-06-06 DIAGNOSIS — R051 Acute cough: Secondary | ICD-10-CM | POA: Insufficient documentation

## 2023-06-10 ENCOUNTER — Other Ambulatory Visit: Payer: Self-pay | Admitting: Internal Medicine

## 2023-06-10 DIAGNOSIS — J329 Chronic sinusitis, unspecified: Secondary | ICD-10-CM

## 2023-06-14 ENCOUNTER — Ambulatory Visit: Payer: BC Managed Care – PPO | Admitting: Cardiology

## 2023-06-14 ENCOUNTER — Encounter: Payer: Self-pay | Admitting: Cardiology

## 2023-06-14 VITALS — BP 110/70 | HR 86 | Ht 72.0 in | Wt 288.0 lb

## 2023-06-14 DIAGNOSIS — R051 Acute cough: Secondary | ICD-10-CM

## 2023-06-14 DIAGNOSIS — E119 Type 2 diabetes mellitus without complications: Secondary | ICD-10-CM

## 2023-06-14 DIAGNOSIS — Z0130 Encounter for examination of blood pressure without abnormal findings: Secondary | ICD-10-CM

## 2023-06-14 DIAGNOSIS — R599 Enlarged lymph nodes, unspecified: Secondary | ICD-10-CM | POA: Diagnosis not present

## 2023-06-14 LAB — POCT CBG (FASTING - GLUCOSE)-MANUAL ENTRY: Glucose Fasting, POC: 119 mg/dL — AB (ref 70–99)

## 2023-06-14 NOTE — Progress Notes (Signed)
Established Patient Office Visit  Subjective:  Patient ID: Patrick Burns, male    DOB: 05-Sep-1960  Age: 62 y.o. MRN: [id number]  Chief Complaint  Patient presents with   Acute Visit    Cough, congestion, left side of jaw swollen     Patient in office for an acute visit, complaining of cough and chest congestion, left side of jaw swollen. Patient seen on 06/05/2023 for similar complaints. Chext xray that day clear. Patient treated with Levofloxacin, Tessalon pearls and albuterol inhaler. Today patient complains of continuous cough with sputum production, patient unsure of what color sputum is. Finished antibiotic yesterday. Recommend patient continue Mucinex, Tessalon pearls, and albuterol inhaler as needed.  Patient reports while eating dinner last night, he felting a burning on the inside of his left cheek, then he began salivating excessively on that side. This morning he woke up to a swollen outer left cheek. Denies pain. Appears to be a swollen lymph node. Patient unable to take ibuprofen due to being on Eliquis, recommend drinking plenty of water.  Patient also asked about exercises to due for poor posture, stretching handout given to patient.    No other concerns at this time.   Past Medical History:  Diagnosis Date   Asthma    Blood dyscrasia    "BLOOD IS THICK"   Diabetes mellitus without complication (HCC)    Dyspnea on exertion    GERD (gastroesophageal reflux disease)    Headache    MIGRAINES   Hypertension    Hypogonadism male    Leaky heart valve    Metastatic cancer (HCC) 2006   Rectum --> prostate --> bladder/chemo and radiation/ [facility]/squamous cell carcinoma anal rectum area   Peripheral edema    Sleep apnea    NOT USING CPAP CURRENTLY   Umbilical hernia     Past Surgical History:  Procedure Laterality Date   COLONOSCOPY  2011   [facility]   KNEE SURGERY     TONSILLECTOMY     UMBILICAL HERNIA REPAIR N/A 05/06/2015   Procedure: HERNIA REPAIR UMBILICAL  ADULT;  Surgeon: Kieth Brightly, MD;  Location: [facility] ORS;  Service: General;  Laterality: N/A;    Social History   Socioeconomic History   Marital status: Married    Spouse name: Not on file   Number of children: Not on file   Years of education: Not on file   Highest education level: Not on file  Occupational History   Not on file  Tobacco Use   Smoking status: Never   Smokeless tobacco: Current    Types: Snuff   Tobacco comments:    smokelss x 44 years (1 can every other day)  Substance and Sexual Activity   Alcohol use: Not Currently    Alcohol/week: 0.0 standard drinks of alcohol    Comment: OCC SIP MOONSHINE   Drug use: No   Sexual activity: Not on file  Other Topics Concern   Not on file  Social History Narrative   Not on file   Social Determinants of Health   Financial Resource Strain: Not on file  Food Insecurity: Not on file  Transportation Needs: Not on file  Physical Activity: Not on file  Stress: Not on file  Social Connections: Not on file  Intimate Partner Violence: Not on file    Family History  Problem Relation Age of Onset   Diabetes Father    Diabetes Brother     Allergies  Allergen Reactions  Bee Venom Anaphylaxis   Ibuprofen Anaphylaxis   Biaxin [Clarithromycin] Nausea And Vomiting   Mango Flavor Swelling    LIPS   Metformin And Related Diarrhea    Review of Systems  Constitutional: Negative.   HENT: Negative.    Eyes: Negative.   Respiratory:  Positive for cough and sputum production. Negative for shortness of breath.   Cardiovascular: Negative.  Negative for chest pain.  Gastrointestinal: Negative.  Negative for abdominal pain, constipation and diarrhea.  Genitourinary: Negative.   Musculoskeletal:  Negative for joint pain and myalgias.  Skin: Negative.   Neurological: Negative.  Negative for dizziness and headaches.  Endo/Heme/Allergies: Negative.   All other systems reviewed and are negative.      Objective:    BP 110/70   Pulse 86   Ht 6' (1.829 m)   Wt 288 lb (130.6 kg)   SpO2 92%   BMI 39.06 kg/m   Vitals:   06/14/23 1043  BP: 110/70  Pulse: 86  Height: 6' (1.829 m)  Weight: 288 lb (130.6 kg)  SpO2: 92%  BMI (Calculated): 39.05    Physical Exam Nursing note reviewed.  Constitutional:      Appearance: Normal appearance. He is normal weight.  HENT:     Head: Normocephalic and atraumatic.      Comments: Swollen lymph node    Nose: Nose normal.     Mouth/Throat:     Mouth: Mucous membranes are moist.     Pharynx: Oropharynx is clear.  Eyes:     Extraocular Movements: Extraocular movements intact.     Conjunctiva/sclera: Conjunctivae normal.     Pupils: Pupils are equal, round, and reactive to light.  Cardiovascular:     Rate and Rhythm: Normal rate and regular rhythm.     Pulses: Normal pulses.     Heart sounds: Normal heart sounds.  Pulmonary:     Effort: Pulmonary effort is normal.     Breath sounds: Normal breath sounds.  Abdominal:     General: Abdomen is flat. Bowel sounds are normal.     Palpations: Abdomen is soft.  Musculoskeletal:        General: Normal range of motion.     Cervical back: Normal range of motion.  Skin:    General: Skin is warm and dry.  Neurological:     General: No focal deficit present.     Mental Status: He is alert and oriented to person, place, and time.  Psychiatric:        Mood and Affect: Mood normal.        Behavior: Behavior normal.        Thought Content: Thought content normal.        Judgment: Judgment normal.      Results for orders placed or performed in visit on 06/14/23  POCT CBG (Fasting - Glucose)  Result Value Ref Range   Glucose Fasting, POC 119 (A) 70 - 99 mg/dL    Recent Results (from the past 2160 hour(s))  CBC With Diff/Platelet     Status: None   Collection Time: 04/02/23  2:01 PM  Result Value Ref Range   WBC 7.1 3.4 - 10.8 x10E3/uL   RBC 5.46 4.14 - 5.80 x10E6/uL   Hemoglobin 15.7 13.0 - 17.7  g/dL   Hematocrit 96.0 45.4 - 51.0 %   MCV 86 79 - 97 fL   MCH 28.8 26.6 - 33.0 pg   MCHC 33.3 31.5 - 35.7 g/dL   RDW 09.8 11.9 -  15.4 %   Platelets 169 150 - 450 x10E3/uL   Neutrophils 61 Not Estab. %   Lymphs 23 Not Estab. %   Monocytes 9 Not Estab. %   Eos 6 Not Estab. %   Basos 1 Not Estab. %   Neutrophils Absolute 4.4 1.4 - 7.0 x10E3/uL   Lymphocytes Absolute 1.6 0.7 - 3.1 x10E3/uL   Monocytes Absolute 0.6 0.1 - 0.9 x10E3/uL   EOS (ABSOLUTE) 0.4 0.0 - 0.4 x10E3/uL   Basophils Absolute 0.1 0.0 - 0.2 x10E3/uL   Immature Granulocytes 0 Not Estab. %   Immature Grans (Abs) 0.0 0.0 - 0.1 x10E3/uL  Hemoglobin A1c     Status: Abnormal   Collection Time: 04/02/23  2:01 PM  Result Value Ref Range   Hgb A1c MFr Bld 6.0 (H) 4.8 - 5.6 %    Comment:          Prediabetes: 5.7 - 6.4          Diabetes: >6.4          Glycemic control for adults with diabetes: <7.0    Est. average glucose Bld gHb Est-mCnc 126 mg/dL  Comprehensive metabolic panel     Status: None   Collection Time: 04/02/23  2:01 PM  Result Value Ref Range   Glucose 95 70 - 99 mg/dL   BUN 17 8 - 27 mg/dL   Creatinine, Ser 1.61 0.76 - 1.27 mg/dL   eGFR 94 >09 UE/AVW/0.98   BUN/Creatinine Ratio 18 10 - 24   Sodium 140 134 - 144 mmol/L   Potassium 4.4 3.5 - 5.2 mmol/L   Chloride 102 96 - 106 mmol/L   CO2 24 20 - 29 mmol/L   Calcium 8.9 8.6 - 10.2 mg/dL   Total Protein 6.6 6.0 - 8.5 g/dL   Albumin 4.2 3.8 - 4.9 g/dL   Globulin, Total 2.4 1.5 - 4.5 g/dL   Bilirubin Total 0.5 0.0 - 1.2 mg/dL   Alkaline Phosphatase 55 44 - 121 IU/L   AST 13 0 - 40 IU/L   ALT 13 0 - 44 IU/L  Lipid panel     Status: Abnormal   Collection Time: 04/02/23  2:01 PM  Result Value Ref Range   Cholesterol, Total 137 100 - 199 mg/dL   Triglycerides 119 (H) 0 - 149 mg/dL   HDL 27 (L) >14 mg/dL   VLDL Cholesterol Cal 45 (H) 5 - 40 mg/dL   LDL Chol Calc (NIH) 65 0 - 99 mg/dL   Chol/HDL Ratio 5.1 (H) 0.0 - 5.0 ratio    Comment:                                    T. Chol/HDL Ratio                                             Men  Women                               1/2 Avg.Risk  3.4    3.3                                   Avg.Risk  5.0    4.4                                2X Avg.Risk  9.6    7.1                                3X Avg.Risk 23.4   11.0   POCT CBG (Fasting - Glucose)     Status: Abnormal   Collection Time: 04/06/23  9:46 AM  Result Value Ref Range   Glucose Fasting, POC 105 (A) 70 - 99 mg/dL  POCT CBG (Fasting - Glucose)     Status: None   Collection Time: 06/05/23  3:19 PM  Result Value Ref Range   Glucose Fasting, POC 86 70 - 99 mg/dL  POCT CBG (Fasting - Glucose)     Status: Abnormal   Collection Time: 06/14/23 10:50 AM  Result Value Ref Range   Glucose Fasting, POC 119 (A) 70 - 99 mg/dL      Assessment & Plan:  Continue Mucinex, Tessalon pearls, and albuterol inhaler as needed. Drink plenty of water.  Stretches to improve posture.    Problem List Items Addressed This Visit       Immune and Lymphatic   Swollen lymph nodes     Other   Acute cough   Other Visit Diagnoses     Type 2 diabetes mellitus without complication, without long-term current use of insulin (HCC)    -  Primary   Relevant Orders   POCT CBG (Fasting - Glucose) (Completed)       Return if symptoms worsen or fail to improve, for keep appt TJ.   Total time spent: 25 minutes  Google, NP  06/14/2023   This document may have been prepared by Dragon Voice Recognition software and as such may include unintentional dictation errors.

## 2023-06-15 ENCOUNTER — Ambulatory Visit (INDEPENDENT_AMBULATORY_CARE_PROVIDER_SITE_OTHER): Payer: BC Managed Care – PPO | Admitting: Internal Medicine

## 2023-06-15 ENCOUNTER — Telehealth: Payer: Self-pay | Admitting: Internal Medicine

## 2023-06-15 ENCOUNTER — Encounter: Payer: Self-pay | Admitting: Internal Medicine

## 2023-06-15 VITALS — BP 100/80 | HR 84 | Ht 72.0 in | Wt 285.0 lb

## 2023-06-15 DIAGNOSIS — E119 Type 2 diabetes mellitus without complications: Secondary | ICD-10-CM | POA: Diagnosis not present

## 2023-06-15 DIAGNOSIS — F17220 Nicotine dependence, chewing tobacco, uncomplicated: Secondary | ICD-10-CM | POA: Diagnosis not present

## 2023-06-15 DIAGNOSIS — S0300XS Dislocation of jaw, unspecified side, sequela: Secondary | ICD-10-CM | POA: Diagnosis not present

## 2023-06-15 DIAGNOSIS — Z0130 Encounter for examination of blood pressure without abnormal findings: Secondary | ICD-10-CM

## 2023-06-15 LAB — POCT CBG (FASTING - GLUCOSE)-MANUAL ENTRY: Glucose Fasting, POC: 101 mg/dL — AB (ref 70–99)

## 2023-06-15 NOTE — Telephone Encounter (Signed)
Patient called stating that he's still in a lot of pain in his jaw, can't hardly open his mouth, painful to chew, when patient leans forward or does a lot of moving around he starts feeling a pounding in jaw. Patient is requesting an xray or blood work to be done. Please adv

## 2023-06-15 NOTE — Progress Notes (Signed)
Established Patient Office Visit  Subjective:  Patient ID: Patrick Burns, male    DOB: 1960-10-26  Age: 62 y.o. MRN: [id number]  Chief Complaint  Patient presents with   Acute Visit    Headaches, dizzy, intense jaw pain    C/o pain and swelling of his left malar region and jaw, pain exacerbated by chewing. Just completed a 7 d course of Levaquin 3 days ago.   No other concerns at this time.   Past Medical History:  Diagnosis Date   Asthma    Blood dyscrasia    "BLOOD IS THICK"   Diabetes mellitus without complication (HCC)    Dyspnea on exertion    GERD (gastroesophageal reflux disease)    Headache    MIGRAINES   Hypertension    Hypogonadism male    Leaky heart valve    Metastatic cancer (HCC) 2006   Rectum --> prostate --> bladder/chemo and radiation/ [facility]/squamous cell carcinoma anal rectum area   Peripheral edema    Sleep apnea    NOT USING CPAP CURRENTLY   Umbilical hernia     Past Surgical History:  Procedure Laterality Date   COLONOSCOPY  2011   [facility]   KNEE SURGERY     TONSILLECTOMY     UMBILICAL HERNIA REPAIR N/A 05/06/2015   Procedure: HERNIA REPAIR UMBILICAL ADULT;  Surgeon: Kieth Brightly, MD;  Location: [facility] ORS;  Service: General;  Laterality: N/A;    Social History   Socioeconomic History   Marital status: Married    Spouse name: Not on file   Number of children: Not on file   Years of education: Not on file   Highest education level: Not on file  Occupational History   Not on file  Tobacco Use   Smoking status: Never   Smokeless tobacco: Current    Types: Snuff   Tobacco comments:    smokelss x 44 years (1 can every other day)  Substance and Sexual Activity   Alcohol use: Not Currently    Alcohol/week: 0.0 standard drinks of alcohol    Comment: OCC SIP MOONSHINE   Drug use: No   Sexual activity: Not on file  Other Topics Concern   Not on file  Social History Narrative   Not on file   Social Determinants of Health    Financial Resource Strain: Not on file  Food Insecurity: Not on file  Transportation Needs: Not on file  Physical Activity: Not on file  Stress: Not on file  Social Connections: Not on file  Intimate Partner Violence: Not on file    Family History  Problem Relation Age of Onset   Diabetes Father    Diabetes Brother     Allergies  Allergen Reactions   Bee Venom Anaphylaxis   Ibuprofen Anaphylaxis   Biaxin [Clarithromycin] Nausea And Vomiting   Mango Flavor Swelling    LIPS   Metformin And Related Diarrhea    Review of Systems  Constitutional: Negative.        Gained 2 lb weight  HENT: Negative.    Eyes: Negative.   Respiratory: Negative.    Cardiovascular: Negative.   Gastrointestinal:  Positive for constipation.  Genitourinary:  Positive for frequency.  Skin: Negative.   Neurological: Negative.   Endo/Heme/Allergies:  Positive for polydipsia.       Objective:   BP 100/80   Pulse 84   Ht 6' (1.829 m)   Wt 285 lb (129.3 kg)   SpO2 97%  BMI 38.65 kg/m   Vitals:   06/15/23 1421  BP: 100/80  Pulse: 84  Height: 6' (1.829 m)  Weight: 285 lb (129.3 kg)  SpO2: 97%  BMI (Calculated): 38.64    Physical Exam Vitals reviewed.  Constitutional:      Appearance: Normal appearance. He is obese.  HENT:     Head: Normocephalic.     Left Ear: There is no impacted cerumen.     Nose: Nose normal.     Mouth/Throat:     Mouth: Mucous membranes are moist.     Pharynx: No posterior oropharyngeal erythema.  Eyes:     Extraocular Movements: Extraocular movements intact.     Pupils: Pupils are equal, round, and reactive to light.  Cardiovascular:     Rate and Rhythm: Regular rhythm.     Chest Wall: PMI is not displaced.     Pulses: Normal pulses.     Heart sounds: Normal heart sounds. No murmur heard. Pulmonary:     Effort: Pulmonary effort is normal.     Breath sounds: Normal air entry. No rhonchi or rales.  Abdominal:     General: Abdomen is flat. Bowel  sounds are normal. There is no distension.     Palpations: Abdomen is soft. There is no hepatomegaly, splenomegaly or mass.     Tenderness: There is no abdominal tenderness.  Musculoskeletal:        General: Normal range of motion.     Cervical back: Normal range of motion and neck supple.     Right lower leg: No edema.     Left lower leg: No edema.  Skin:    General: Skin is warm and dry.  Neurological:     General: No focal deficit present.     Mental Status: He is alert and oriented to person, place, and time.     Cranial Nerves: No cranial nerve deficit.     Motor: No weakness.  Psychiatric:        Mood and Affect: Mood normal.        Behavior: Behavior normal.      Results for orders placed or performed in visit on 06/15/23  POCT CBG (Fasting - Glucose)  Result Value Ref Range   Glucose Fasting, POC 101 (A) 70 - 99 mg/dL        Assessment & Plan:  As per problem list. Parotitis less likely given he finished a course of Levaquin two days ago. Instructions given for TMJ care and to contact his dentist if fails to improve.  Problem List Items Addressed This Visit   None Visit Diagnoses     Type 2 diabetes mellitus without complication, without long-term current use of insulin (HCC)    -  Primary   Relevant Orders   POCT CBG (Fasting - Glucose) (Completed)   TMJ (dislocation of temporomandibular joint), sequela           Return if symptoms worsen or fail to improve.   Total time spent: 20 minutes  Luna Fuse, MD  06/15/2023   This document may have been prepared by [facility] Voice Recognition software and as such may include unintentional dictation errors.

## 2023-06-27 ENCOUNTER — Other Ambulatory Visit: Payer: Self-pay | Admitting: Internal Medicine

## 2023-06-27 DIAGNOSIS — E119 Type 2 diabetes mellitus without complications: Secondary | ICD-10-CM

## 2023-07-01 ENCOUNTER — Other Ambulatory Visit: Payer: Self-pay | Admitting: Internal Medicine

## 2023-07-01 DIAGNOSIS — K219 Gastro-esophageal reflux disease without esophagitis: Secondary | ICD-10-CM

## 2023-07-09 ENCOUNTER — Other Ambulatory Visit: Payer: BC Managed Care – PPO

## 2023-07-09 DIAGNOSIS — E782 Mixed hyperlipidemia: Secondary | ICD-10-CM

## 2023-07-09 DIAGNOSIS — I10 Essential (primary) hypertension: Secondary | ICD-10-CM

## 2023-07-09 DIAGNOSIS — E119 Type 2 diabetes mellitus without complications: Secondary | ICD-10-CM

## 2023-07-10 LAB — COMPREHENSIVE METABOLIC PANEL
ALT: 13 [IU]/L (ref 0–44)
AST: 14 [IU]/L (ref 0–40)
Albumin: 4.4 g/dL (ref 3.9–4.9)
Alkaline Phosphatase: 66 [IU]/L (ref 44–121)
BUN/Creatinine Ratio: 15 (ref 10–24)
BUN: 14 mg/dL (ref 8–27)
Bilirubin Total: 0.8 mg/dL (ref 0.0–1.2)
CO2: 24 mmol/L (ref 20–29)
Calcium: 9.5 mg/dL (ref 8.6–10.2)
Chloride: 101 mmol/L (ref 96–106)
Creatinine, Ser: 0.92 mg/dL (ref 0.76–1.27)
Globulin, Total: 2.5 g/dL (ref 1.5–4.5)
Glucose: 97 mg/dL (ref 70–99)
Potassium: 4.6 mmol/L (ref 3.5–5.2)
Sodium: 138 mmol/L (ref 134–144)
Total Protein: 6.9 g/dL (ref 6.0–8.5)
eGFR: 95 mL/min/{1.73_m2} (ref >59)

## 2023-07-10 LAB — HEMOGLOBIN A1C
Est. average glucose Bld gHb Est-mCnc: 126 mg/dL
Hgb A1c MFr Bld: 6 % — ABNORMAL HIGH (ref 4.8–5.6)

## 2023-07-10 LAB — LIPID PANEL
Chol/HDL Ratio: 4.3 ratio (ref 0.0–5.0)
Cholesterol, Total: 133 mg/dL (ref 100–199)
HDL: 31 mg/dL — ABNORMAL LOW (ref >39)
LDL Chol Calc (NIH): 71 mg/dL (ref 0–99)
Triglycerides: 184 mg/dL — ABNORMAL HIGH (ref 0–149)
VLDL Cholesterol Cal: 31 mg/dL (ref 5–40)

## 2023-07-13 ENCOUNTER — Ambulatory Visit (INDEPENDENT_AMBULATORY_CARE_PROVIDER_SITE_OTHER): Payer: BC Managed Care – PPO | Admitting: Internal Medicine

## 2023-07-13 ENCOUNTER — Encounter: Payer: Self-pay | Admitting: Internal Medicine

## 2023-07-13 VITALS — BP 125/84 | HR 73 | Ht 72.0 in | Wt 278.0 lb

## 2023-07-13 DIAGNOSIS — R351 Nocturia: Secondary | ICD-10-CM

## 2023-07-13 DIAGNOSIS — I10 Essential (primary) hypertension: Secondary | ICD-10-CM | POA: Diagnosis not present

## 2023-07-13 DIAGNOSIS — E119 Type 2 diabetes mellitus without complications: Secondary | ICD-10-CM

## 2023-07-13 DIAGNOSIS — H1013 Acute atopic conjunctivitis, bilateral: Secondary | ICD-10-CM | POA: Diagnosis not present

## 2023-07-13 DIAGNOSIS — N401 Enlarged prostate with lower urinary tract symptoms: Secondary | ICD-10-CM

## 2023-07-13 DIAGNOSIS — Z23 Encounter for immunization: Secondary | ICD-10-CM | POA: Diagnosis not present

## 2023-07-13 LAB — POC CREATINE & ALBUMIN,URINE
Albumin/Creatinine Ratio, Urine, POC: 30
Creatinine, POC: 200 mg/dL
Microalbumin Ur, POC: <30 mg/L

## 2023-07-13 LAB — POCT CBG (FASTING - GLUCOSE)-MANUAL ENTRY: Glucose Fasting, POC: 101 mg/dL — AB (ref 70–99)

## 2023-07-13 MED ORDER — AMLODIPINE BESY-BENAZEPRIL HCL 5-20 MG PO CAPS: 1.0000 | ORAL_CAPSULE | Freq: Every day | ORAL | 0 refills | Status: DC

## 2023-07-13 NOTE — Progress Notes (Signed)
Established Patient Office Visit  Subjective:  Patient ID: Patrick Burns, male    DOB: May 13, 1961  Age: 62 y.o. MRN: [id number]  Chief Complaint  Patient presents with   Follow-up    3 month follow up      No new complaints except flare of allergic conjunctivitis and also here for lab review and medication refills. Labs reviewed and notable for well controlled diabetes, A1c at target, lipids at target with unremarkable cmp. Denies any hypoglycemic episodes and home bg readings have been at target.    No other concerns at this time.   Past Medical History:  Diagnosis Date   Asthma    Blood dyscrasia    "BLOOD IS THICK"   Diabetes mellitus without complication (HCC)    Dyspnea on exertion    GERD (gastroesophageal reflux disease)    Headache    MIGRAINES   Hypertension    Hypogonadism male    Leaky heart valve    Metastatic cancer (HCC) 2006   Rectum --> prostate --> bladder/chemo and radiation/ [facility]/squamous cell carcinoma anal rectum area   Peripheral edema    Sleep apnea    NOT USING CPAP CURRENTLY   Umbilical hernia     Past Surgical History:  Procedure Laterality Date   COLONOSCOPY  2011   [facility]   KNEE SURGERY     TONSILLECTOMY     UMBILICAL HERNIA REPAIR N/A 05/06/2015   Procedure: HERNIA REPAIR UMBILICAL ADULT;  Surgeon: Kieth Brightly, MD;  Location: [facility] ORS;  Service: General;  Laterality: N/A;    Social History   Socioeconomic History   Marital status: Married    Spouse name: Not on file   Number of children: Not on file   Years of education: Not on file   Highest education level: Not on file  Occupational History   Not on file  Tobacco Use   Smoking status: Never   Smokeless tobacco: Current    Types: Snuff   Tobacco comments:    smokelss x 44 years (1 can every other day)  Substance and Sexual Activity   Alcohol use: Not Currently    Alcohol/week: 0.0 standard drinks of alcohol    Comment: OCC SIP MOONSHINE   Drug use: No    Sexual activity: Not on file  Other Topics Concern   Not on file  Social History Narrative   Not on file   Social Determinants of Health   Financial Resource Strain: Not on file  Food Insecurity: Not on file  Transportation Needs: Not on file  Physical Activity: Not on file  Stress: Not on file  Social Connections: Not on file  Intimate Partner Violence: Not on file    Family History  Problem Relation Age of Onset   Diabetes Father    Diabetes Brother     Allergies  Allergen Reactions   Bee Venom Anaphylaxis   Ibuprofen Anaphylaxis   Biaxin [Clarithromycin] Nausea And Vomiting   Mango Flavor Swelling    LIPS   Metformin And Related Diarrhea    Outpatient Medications Prior to Visit  Medication Sig   FARXIGA 10 MG TABS tablet Take 10 mg by mouth daily.   ACCU-CHEK GUIDE test strip 1 each by Other route as needed.   Accu-Chek Softclix Lancets lancets SMARTSIG:Topical   Acetaminophen-Caffeine (EXCEDRIN TENSION HEADACHE PO) Take 2 tablets by mouth daily as needed.   albuterol (VENTOLIN HFA) 108 (90 Base) MCG/ACT inhaler Inhale 2 puffs into the lungs  every 6 (six) hours as needed for wheezing or shortness of breath.   amLODipine-benazepril (LOTREL) 5-20 MG capsule TAKE 1 CAPSULE BY MOUTH EVERY DAY IN THE MORNING   Ascorbic Acid (VITAMIN C) 1000 MG tablet Take 1,000 mg by mouth daily.   benzonatate (TESSALON PERLES) 100 MG capsule Take 1 capsule (100 mg total) by mouth 3 (three) times daily as needed for cough.   Blood Glucose Monitoring Suppl (ACCU-CHEK GUIDE ME) w/Device KIT USE TO CHECK BLOOD SUGARS TWICE DAILY   cetirizine (ZYRTEC) 10 MG tablet Take 10 mg by mouth daily.   Cholecalciferol (VITAMIN D3 PO) Take 1,000 Units by mouth every morning.   CINNAMON PO Take 2,000 mg by mouth every morning.   Coenzyme Q10 (CO Q-10) 200 MG CAPS Take 1 capsule by mouth daily.   ELIQUIS 5 MG TABS tablet TAKE 1 TABLET BY MOUTH TWICE A DAY   EPINEPHrine (EPIPEN IJ) Inject 1 Dose as  directed as needed. (Patient not taking: Reported on 04/20/2022)   famotidine (PEPCID) 20 MG tablet TAKE 1 TABLET BY MOUTH TWICE A DAY   fluticasone (FLONASE) 50 MCG/ACT nasal spray INSTILL ONE SPRAY INTO EACH NOSTRIL ONCE A DAY   furosemide (LASIX) 20 MG tablet ONCE A DAY FOR 3 DAYS THEN DAILY AS NEEDED (Patient not taking: Reported on 04/06/2023)   KRILL OIL PO Take 1 capsule by mouth daily.   metoprolol tartrate (LOPRESSOR) 25 MG tablet TAKE 1 TABLET BY MOUTH TWICE A DAY   Multiple Vitamin (MULTI-VITAMINS) TABS Take 1 tablet by mouth daily.    Omega-3 Fatty Acids (OMEGA-3 FISH OIL PO) Take 1,000 mg by mouth every morning.   rosuvastatin (CRESTOR) 10 MG tablet TAKE 1 TABLET BY MOUTH EVERY DAY IN THE EVENING   Semaglutide, 1 MG/DOSE, (OZEMPIC, 1 MG/DOSE,) 4 MG/3ML SOPN INJECT 1MG  INTO THE SKIN ONCE A WEEK   Testosterone 1.62 % GEL Apply 1 application topically daily.    No facility-administered medications prior to visit.    Review of Systems  Constitutional:  Positive for weight loss (2 lbs).       Objective:   BP 125/84   Pulse 73   Ht 6' (1.829 m)   Wt 278 lb (126.1 kg)   SpO2 97%   BMI 37.70 kg/m   Vitals:   07/13/23 0943  BP: 125/84  Pulse: 73  Height: 6' (1.829 m)  Weight: 278 lb (126.1 kg)  SpO2: 97%  BMI (Calculated): 37.7    Physical Exam Vitals reviewed.  Constitutional:      Appearance: Normal appearance. He is obese.  HENT:     Head: Normocephalic.     Left Ear: There is no impacted cerumen.     Nose: Nose normal.     Mouth/Throat:     Mouth: Mucous membranes are moist.     Pharynx: No posterior oropharyngeal erythema.  Eyes:     Extraocular Movements: Extraocular movements intact.     Pupils: Pupils are equal, round, and reactive to light.  Cardiovascular:     Rate and Rhythm: Regular rhythm.     Chest Wall: PMI is not displaced.     Pulses: Normal pulses.     Heart sounds: Normal heart sounds. No murmur heard. Pulmonary:     Effort: Pulmonary  effort is normal.     Breath sounds: Normal air entry. No rhonchi or rales.  Abdominal:     General: Abdomen is flat. Bowel sounds are normal. There is no distension.  Palpations: Abdomen is soft. There is no hepatomegaly, splenomegaly or mass.     Tenderness: There is no abdominal tenderness.  Musculoskeletal:        General: Normal range of motion.     Cervical back: Normal range of motion and neck supple.     Right lower leg: No edema.     Left lower leg: No edema.  Skin:    General: Skin is warm and dry.  Neurological:     General: No focal deficit present.     Mental Status: He is alert and oriented to person, place, and time.     Cranial Nerves: No cranial nerve deficit.     Motor: No weakness.  Psychiatric:        Mood and Affect: Mood normal.        Behavior: Behavior normal.      Results for orders placed or performed in visit on 07/13/23  POCT CBG (Fasting - Glucose)  Result Value Ref Range   Glucose Fasting, POC 101 (A) 70 - 99 mg/dL    Recent Results (from the past 2160 hour(Naphtali Zywicki))  POCT CBG (Fasting - Glucose)     Status: None   Collection Time: 06/05/23  3:19 PM  Result Value Ref Range   Glucose Fasting, POC 86 70 - 99 mg/dL  POCT CBG (Fasting - Glucose)     Status: Abnormal   Collection Time: 06/14/23 10:50 AM  Result Value Ref Range   Glucose Fasting, POC 119 (A) 70 - 99 mg/dL  POCT CBG (Fasting - Glucose)     Status: Abnormal   Collection Time: 06/15/23  2:24 PM  Result Value Ref Range   Glucose Fasting, POC 101 (A) 70 - 99 mg/dL  Hemoglobin W0J     Status: Abnormal   Collection Time: 07/09/23  1:39 PM  Result Value Ref Range   Hgb A1c MFr Bld 6.0 (H) 4.8 - 5.6 %    Comment:          Prediabetes: 5.7 - 6.4          Diabetes: >6.4          Glycemic control for adults with diabetes: <7.0    Est. average glucose Bld gHb Est-mCnc 126 mg/dL  Lipid panel     Status: Abnormal   Collection Time: 07/09/23  1:39 PM  Result Value Ref Range   Cholesterol,  Total 133 100 - 199 mg/dL   Triglycerides 811 (H) 0 - 149 mg/dL   HDL 31 (L) >91 mg/dL   VLDL Cholesterol Cal 31 5 - 40 mg/dL   LDL Chol Calc (NIH) 71 0 - 99 mg/dL   Chol/HDL Ratio 4.3 0.0 - 5.0 ratio    Comment:                                   T. Chol/HDL Ratio                                             Men  Women                               1/2 Avg.Risk  3.4    3.3  Avg.Risk  5.0    4.4                                2X Avg.Risk  9.6    7.1                                3X Avg.Risk 23.4   11.0   Comprehensive metabolic panel     Status: None   Collection Time: 07/09/23  1:39 PM  Result Value Ref Range   Glucose 97 70 - 99 mg/dL   BUN 14 8 - 27 mg/dL   Creatinine, Ser 0.86 0.76 - 1.27 mg/dL   eGFR 95 >57 QI/ONG/2.95   BUN/Creatinine Ratio 15 10 - 24   Sodium 138 134 - 144 mmol/L   Potassium 4.6 3.5 - 5.2 mmol/L   Chloride 101 96 - 106 mmol/L   CO2 24 20 - 29 mmol/L   Calcium 9.5 8.6 - 10.2 mg/dL   Total Protein 6.9 6.0 - 8.5 g/dL   Albumin 4.4 3.9 - 4.9 g/dL   Globulin, Total 2.5 1.5 - 4.5 g/dL   Bilirubin Total 0.8 0.0 - 1.2 mg/dL   Alkaline Phosphatase 66 44 - 121 IU/L   AST 14 0 - 40 IU/L   ALT 13 0 - 44 IU/L  POCT CBG (Fasting - Glucose)     Status: Abnormal   Collection Time: 07/13/23  9:47 AM  Result Value Ref Range   Glucose Fasting, POC 101 (A) 70 - 99 mg/dL      Assessment & Plan:  As per problem list  Problem List Items Addressed This Visit   None Visit Diagnoses     Type 2 diabetes mellitus without complication, without long-term current use of insulin (HCC)    -  Primary   Relevant Medications   FARXIGA 10 MG TABS tablet   Other Relevant Orders   POCT CBG (Fasting - Glucose) (Completed)   Allergic conjunctivitis of both eyes       BPH associated with nocturia       Relevant Orders   PSA       Return in about 3 months (around 10/13/2023) for fu with labs prior.   Total time spent: 20 minutes  Luna Fuse, MD  07/13/2023   This document may have been prepared by [facility] Voice Recognition software and as such may include unintentional dictation errors.

## 2023-07-15 LAB — SPECIMEN STATUS REPORT

## 2023-07-15 LAB — PSA: Prostate Specific Ag, Serum: <0.1 ng/mL (ref 0.0–4.0)

## 2023-07-17 ENCOUNTER — Ambulatory Visit (INDEPENDENT_AMBULATORY_CARE_PROVIDER_SITE_OTHER): Payer: BC Managed Care – PPO | Admitting: Cardiovascular Disease

## 2023-07-17 ENCOUNTER — Encounter: Payer: Self-pay | Admitting: Cardiovascular Disease

## 2023-07-17 VITALS — BP 128/75 | HR 79 | Ht 72.0 in | Wt 284.0 lb

## 2023-07-17 DIAGNOSIS — E782 Mixed hyperlipidemia: Secondary | ICD-10-CM

## 2023-07-17 DIAGNOSIS — I872 Venous insufficiency (chronic) (peripheral): Secondary | ICD-10-CM

## 2023-07-17 DIAGNOSIS — I10 Essential (primary) hypertension: Secondary | ICD-10-CM | POA: Diagnosis not present

## 2023-07-17 DIAGNOSIS — G4733 Obstructive sleep apnea (adult) (pediatric): Secondary | ICD-10-CM

## 2023-07-17 DIAGNOSIS — I4891 Unspecified atrial fibrillation: Secondary | ICD-10-CM

## 2023-07-17 DIAGNOSIS — R42 Dizziness and giddiness: Secondary | ICD-10-CM

## 2023-07-17 NOTE — Progress Notes (Signed)
Cardiology Office Note   Date:  07/17/2023   ID:  Patrick Burns, DOB [birth year], MRN [id number]  PCP:  Sherron Monday, MD  Cardiologist:  Adrian Blackwater, MD      History of Present Illness: Patrick Burns is a 62 y.o. male who presents for  Chief Complaint  Patient presents with   Follow-up    Eyes start watering, stopped takling zertic      Past Medical History:  Diagnosis Date   Asthma    Blood dyscrasia    "BLOOD IS THICK"   Diabetes mellitus without complication (HCC)    Dyspnea on exertion    GERD (gastroesophageal reflux disease)    Headache    MIGRAINES   Hypertension    Hypogonadism male    Leaky heart valve    Metastatic cancer (HCC) 2006   Rectum --> prostate --> bladder/chemo and radiation/ [facility]/squamous cell carcinoma anal rectum area   Peripheral edema    Sleep apnea    NOT USING CPAP CURRENTLY   Umbilical hernia      Past Surgical History:  Procedure Laterality Date   COLONOSCOPY  2011   [facility]   KNEE SURGERY     TONSILLECTOMY     UMBILICAL HERNIA REPAIR N/A 05/06/2015   Procedure: HERNIA REPAIR UMBILICAL ADULT;  Surgeon: Kieth Brightly, MD;  Location: [facility] ORS;  Service: General;  Laterality: N/A;     Current Outpatient Medications  Medication Sig Dispense Refill   ACCU-CHEK GUIDE test strip 1 each by Other route as needed. 100 each 3   Accu-Chek Softclix Lancets lancets SMARTSIG:Topical     Acetaminophen-Caffeine (EXCEDRIN TENSION HEADACHE PO) Take 2 tablets by mouth daily as needed.     albuterol (VENTOLIN HFA) 108 (90 Base) MCG/ACT inhaler Inhale 2 puffs into the lungs every 6 (six) hours as needed for wheezing or shortness of breath. 8.5 each 5   amLODipine-benazepril (LOTREL) 5-20 MG capsule Take 1 capsule by mouth daily. 90 capsule 0   Ascorbic Acid (VITAMIN C) 1000 MG tablet Take 1,000 mg by mouth daily.     benzonatate (TESSALON PERLES) 100 MG capsule Take 1 capsule (100 mg total) by mouth 3 (three) times  daily as needed for cough. 30 capsule 1   Blood Glucose Monitoring Suppl (ACCU-CHEK GUIDE ME) w/Device KIT USE TO CHECK BLOOD SUGARS TWICE DAILY 1 kit 0   cetirizine (ZYRTEC) 10 MG tablet Take 10 mg by mouth daily. (Patient not taking: Reported on 07/17/2023)     Cholecalciferol (VITAMIN D3 PO) Take 1,000 Units by mouth every morning.     CINNAMON PO Take 2,000 mg by mouth every morning.     Coenzyme Q10 (CO Q-10) 200 MG CAPS Take 1 capsule by mouth daily.     ELIQUIS 5 MG TABS tablet TAKE 1 TABLET BY MOUTH TWICE A DAY 180 tablet 0   EPINEPHrine (EPIPEN IJ) Inject 1 Dose as directed as needed. (Patient not taking: Reported on 04/20/2022)     famotidine (PEPCID) 20 MG tablet TAKE 1 TABLET BY MOUTH TWICE A DAY 180 tablet 0   FARXIGA 10 MG TABS tablet Take 10 mg by mouth daily.     fluticasone (FLONASE) 50 MCG/ACT nasal spray INSTILL ONE SPRAY INTO EACH NOSTRIL ONCE A DAY 48 mL 0   furosemide (LASIX) 20 MG tablet ONCE A DAY FOR 3 DAYS THEN DAILY AS NEEDED (Patient not taking: Reported on 04/06/2023) 90 tablet 1   KRILL OIL PO  Take 1 capsule by mouth daily.     metoprolol tartrate (LOPRESSOR) 25 MG tablet TAKE 1 TABLET BY MOUTH TWICE A DAY 180 tablet 4   Multiple Vitamin (MULTI-VITAMINS) TABS Take 1 tablet by mouth daily.      Omega-3 Fatty Acids (OMEGA-3 FISH OIL PO) Take 1,000 mg by mouth every morning.     rosuvastatin (CRESTOR) 10 MG tablet TAKE 1 TABLET BY MOUTH EVERY DAY IN THE EVENING 90 tablet 0   Semaglutide, 1 MG/DOSE, (OZEMPIC, 1 MG/DOSE,) 4 MG/3ML SOPN INJECT 1MG  INTO THE SKIN ONCE A WEEK 3 mL 2   Testosterone 1.62 % GEL Apply 1 application topically daily.      No current facility-administered medications for this visit.    Allergies:   Bee venom, Ibuprofen, Biaxin [clarithromycin], Mango flavor, and Metformin and related    Social History:   reports that he has never smoked. His smokeless tobacco use includes snuff. He reports that he does not currently use alcohol. He reports that  he does not use drugs.   Family History:  family history includes Diabetes in his brother and father.    ROS:     Review of Systems  Constitutional: Negative.   HENT: Negative.    Eyes: Negative.   Respiratory: Negative.    Gastrointestinal: Negative.   Genitourinary: Negative.   Musculoskeletal: Negative.   Skin: Negative.   Neurological: Negative.   Endo/Heme/Allergies: Negative.   Psychiatric/Behavioral: Negative.    All other systems reviewed and are negative.     All other systems are reviewed and negative.    PHYSICAL EXAM: VS:  BP 128/75   Pulse 79   Ht 6' (1.829 m)   Wt 284 lb (128.8 kg)   SpO2 93%   BMI 38.52 kg/m  , BMI Body mass index is 38.52 kg/m. Last weight:  Wt Readings from Last 3 Encounters:  07/17/23 284 lb (128.8 kg)  07/13/23 278 lb (126.1 kg)  06/15/23 285 lb (129.3 kg)     Physical Exam Vitals reviewed.  Constitutional:      Appearance: Normal appearance. He is normal weight.  HENT:     Head: Normocephalic.     Nose: Nose normal.     Mouth/Throat:     Mouth: Mucous membranes are moist.  Eyes:     Pupils: Pupils are equal, round, and reactive to light.  Cardiovascular:     Rate and Rhythm: Normal rate and regular rhythm.     Pulses: Normal pulses.     Heart sounds: Normal heart sounds.  Pulmonary:     Effort: Pulmonary effort is normal.  Abdominal:     General: Abdomen is flat. Bowel sounds are normal.  Musculoskeletal:        General: Normal range of motion.     Cervical back: Normal range of motion.  Skin:    General: Skin is warm.  Neurological:     General: No focal deficit present.     Mental Status: He is alert.  Psychiatric:        Mood and Affect: Mood normal.       EKG:   Recent Labs: 04/02/2023: Hemoglobin 15.7; Platelets 169 07/09/2023: ALT 13; BUN 14; Creatinine, Ser 0.92; Potassium 4.6; Sodium 138    Lipid Panel    Component Value Date/Time   CHOL 133 07/09/2023 1339   TRIG 184 (H) 07/09/2023 1339    HDL 31 (L) 07/09/2023 1339   CHOLHDL 4.3 07/09/2023 1339   LDLCALC 71 07/09/2023  1339      Other studies Reviewed: Additional studies/ records that were reviewed today include:  Review of the above records demonstrates:       No data to display            ASSESSMENT AND PLAN:    ICD-10-CM   1. New onset atrial fibrillation with RVR(HCC)  I48.91    continue metoprolol and elliquis. No further palpitation    2. Essential hypertension  I10     3. Chronic venous insufficiency  I87.2     4. Obstructive sleep apnea syndrome  G47.33     5. Mixed hyperlipidemia  E78.2     6. Dizziness  R42    Take lasix PRN as gets dehydrated       Problem List Items Addressed This Visit       Cardiovascular and Mediastinum   Essential hypertension   Chronic venous insufficiency   New onset atrial fibrillation with RVR(HCC) - Primary     Respiratory   Sleep apnea     Other   Hyperlipidemia   Other Visit Diagnoses     Dizziness       Take lasix PRN as gets dehydrated          Disposition:   Return in about 3 months (around 10/17/2023).    Total time spent: 30 minutes  Signed,  Adrian Blackwater, MD  07/17/2023 11:41 AM    [facility]

## 2023-07-30 ENCOUNTER — Telehealth: Payer: Self-pay | Admitting: Internal Medicine

## 2023-07-30 NOTE — Telephone Encounter (Signed)
PT hasn't pooped in 4 days & wants to know if he can take magnesium citrate with his meds?

## 2023-08-15 ENCOUNTER — Other Ambulatory Visit: Payer: Self-pay | Admitting: Internal Medicine

## 2023-08-15 DIAGNOSIS — J329 Chronic sinusitis, unspecified: Secondary | ICD-10-CM

## 2023-08-25 ENCOUNTER — Other Ambulatory Visit: Payer: Self-pay | Admitting: Internal Medicine

## 2023-08-31 ENCOUNTER — Encounter: Payer: Self-pay | Admitting: Internal Medicine

## 2023-09-09 ENCOUNTER — Other Ambulatory Visit: Payer: Self-pay | Admitting: Internal Medicine

## 2023-09-09 DIAGNOSIS — E119 Type 2 diabetes mellitus without complications: Secondary | ICD-10-CM

## 2023-09-22 ENCOUNTER — Other Ambulatory Visit: Payer: Self-pay | Admitting: Internal Medicine

## 2023-09-22 DIAGNOSIS — E119 Type 2 diabetes mellitus without complications: Secondary | ICD-10-CM

## 2023-10-01 ENCOUNTER — Telehealth: Payer: Self-pay

## 2023-10-01 NOTE — Telephone Encounter (Signed)
Spoke with patient, he states that his pay flex acct card was messing up and it should be at house in the next 15 days, patient will call back for samples if we get them

## 2023-10-01 NOTE — Telephone Encounter (Signed)
Patient Patrick Burns asking for call back about Ozempic states he can't pay for the rx but its getting a new insurance card, his message didn't make much sense, will call to find out what's going on .

## 2023-10-03 ENCOUNTER — Other Ambulatory Visit: Payer: Self-pay | Admitting: Internal Medicine

## 2023-10-08 ENCOUNTER — Other Ambulatory Visit: Payer: BC Managed Care – PPO

## 2023-10-08 ENCOUNTER — Other Ambulatory Visit: Payer: Self-pay | Admitting: Internal Medicine

## 2023-10-08 DIAGNOSIS — E119 Type 2 diabetes mellitus without complications: Secondary | ICD-10-CM

## 2023-10-08 DIAGNOSIS — K219 Gastro-esophageal reflux disease without esophagitis: Secondary | ICD-10-CM

## 2023-10-09 LAB — LIPID PANEL
Chol/HDL Ratio: 4.3 {ratio} (ref 0.0–5.0)
Cholesterol, Total: 134 mg/dL (ref 100–199)
HDL: 31 mg/dL — ABNORMAL LOW (ref >39)
LDL Chol Calc (NIH): 71 mg/dL (ref 0–99)
Triglycerides: 190 mg/dL — ABNORMAL HIGH (ref 0–149)
VLDL Cholesterol Cal: 32 mg/dL (ref 5–40)

## 2023-10-09 LAB — HEMOGLOBIN A1C
Est. average glucose Bld gHb Est-mCnc: 123 mg/dL
Hgb A1c MFr Bld: 5.9 % — ABNORMAL HIGH (ref 4.8–5.6)

## 2023-10-12 ENCOUNTER — Encounter: Payer: Self-pay | Admitting: Internal Medicine

## 2023-10-12 ENCOUNTER — Ambulatory Visit: Payer: BC Managed Care – PPO | Admitting: Internal Medicine

## 2023-10-12 VITALS — BP 110/78 | HR 78 | Temp 98.7°F | Ht 72.0 in | Wt 282.0 lb

## 2023-10-12 DIAGNOSIS — E119 Type 2 diabetes mellitus without complications: Secondary | ICD-10-CM | POA: Diagnosis not present

## 2023-10-12 DIAGNOSIS — I10 Essential (primary) hypertension: Secondary | ICD-10-CM | POA: Diagnosis not present

## 2023-10-12 DIAGNOSIS — Z1211 Encounter for screening for malignant neoplasm of colon: Secondary | ICD-10-CM | POA: Diagnosis not present

## 2023-10-12 LAB — POCT CBG (FASTING - GLUCOSE)-MANUAL ENTRY: Glucose Fasting, POC: 100 mg/dL — AB (ref 70–99)

## 2023-10-12 MED ORDER — AMLODIPINE BESY-BENAZEPRIL HCL 5-20 MG PO CAPS: 1.0000 | ORAL_CAPSULE | Freq: Every day | ORAL | 0 refills | Status: DC

## 2023-10-12 NOTE — Progress Notes (Signed)
Established Patient Office Visit  Subjective:  Patient ID: Patrick Burns, male    DOB: [birth year]  Age: 63 y.o. MRN: [id number]  Chief Complaint  Patient presents with   Follow-up    3 month follow up     No new complaints, here for lab review and medication refills. Labs reviewed and notable for well controlled diabetes, A1c at target, cholesterol at target  but elevated trigs. Denies any hypoglycemic episodes and home bg readings have been at target.     No other concerns at this time.   Past Medical History:  Diagnosis Date   Asthma    Blood dyscrasia    "BLOOD IS THICK"   Diabetes mellitus without complication (HCC)    Dyspnea on exertion    GERD (gastroesophageal reflux disease)    Headache    MIGRAINES   Hypertension    Hypogonadism male    Leaky heart valve    Metastatic cancer (HCC) 2006   Rectum --> prostate --> bladder/chemo and radiation/ [facility]/squamous cell carcinoma anal rectum area   Peripheral edema    Sleep apnea    NOT USING CPAP CURRENTLY   Umbilical hernia     Past Surgical History:  Procedure Laterality Date   COLONOSCOPY  2011   [facility]   KNEE SURGERY     TONSILLECTOMY     UMBILICAL HERNIA REPAIR N/A 05/06/2015   Procedure: HERNIA REPAIR UMBILICAL ADULT;  Surgeon: Kieth Brightly, MD;  Location: [facility] ORS;  Service: General;  Laterality: N/A;    Social History   Socioeconomic History   Marital status: Married    Spouse name: Not on file   Number of children: Not on file   Years of education: Not on file   Highest education level: Not on file  Occupational History   Not on file  Tobacco Use   Smoking status: Never   Smokeless tobacco: Current    Types: Snuff   Tobacco comments:    smokelss x 44 years (1 can every other day)  Substance and Sexual Activity   Alcohol use: Not Currently    Alcohol/week: 0.0 standard drinks of alcohol    Comment: OCC SIP [place]   Drug use: No   Sexual activity: Not on file  Other Topics  Concern   Not on file  Social History Narrative   Not on file   Social Drivers of Health   Financial Resource Strain: Not on file  Food Insecurity: Not on file  Transportation Needs: Not on file  Physical Activity: Not on file  Stress: Not on file  Social Connections: Not on file  Intimate Partner Violence: Not on file    Family History  Problem Relation Age of Onset   Diabetes Father    Diabetes Brother     Allergies  Allergen Reactions   Bee Venom Anaphylaxis   Ibuprofen Anaphylaxis   Biaxin [Clarithromycin] Nausea And Vomiting   Mango Flavoring Agent (Non-Screening) Swelling    LIPS   Metformin And Related Diarrhea    Outpatient Medications Prior to Visit  Medication Sig   ACCU-CHEK GUIDE test strip 1 each by Other route as needed.   Accu-Chek Softclix Lancets lancets SMARTSIG:Topical   Acetaminophen-Caffeine (EXCEDRIN TENSION HEADACHE PO) Take 2 tablets by mouth daily as needed.   albuterol (VENTOLIN HFA) 108 (90 Base) MCG/ACT inhaler Inhale 2 puffs into the lungs every 6 (six) hours as needed for wheezing or shortness of breath.   Ascorbic Acid (VITAMIN  C) 1000 MG tablet Take 1,000 mg by mouth daily.   benzonatate (TESSALON PERLES) 100 MG capsule Take 1 capsule (100 mg total) by mouth 3 (three) times daily as needed for cough.   Blood Glucose Monitoring Suppl (ACCU-CHEK GUIDE ME) w/Device KIT USE TO CHECK BLOOD SUGARS TWICE DAILY   cetirizine (ZYRTEC) 10 MG tablet Take 10 mg by mouth daily. (Patient not taking: Reported on 07/17/2023)   Cholecalciferol (VITAMIN D3 PO) Take 1,000 Units by mouth every morning.   CINNAMON PO Take 2,000 mg by mouth every morning.   Coenzyme Q10 (CO Q-10) 200 MG CAPS Take 1 capsule by mouth daily.   dapagliflozin propanediol (FARXIGA) 10 MG TABS tablet TAKE 1 TABLET BY MOUTH EVERY DAY   ELIQUIS 5 MG TABS tablet TAKE 1 TABLET BY MOUTH TWICE A DAY   EPINEPHrine (EPIPEN IJ) Inject 1 Dose as directed as needed. (Patient not taking: Reported  on 04/20/2022)   famotidine (PEPCID) 20 MG tablet TAKE 1 TABLET BY MOUTH TWICE A DAY   fluticasone (FLONASE) 50 MCG/ACT nasal spray INSTILL ONE SPRAY INTO EACH NOSTRIL ONCE A DAY   furosemide (LASIX) 20 MG tablet ONCE A DAY FOR 3 DAYS THEN DAILY AS NEEDED (Patient not taking: Reported on 04/06/2023)   KRILL OIL PO Take 1 capsule by mouth daily.   metoprolol tartrate (LOPRESSOR) 25 MG tablet TAKE 1 TABLET BY MOUTH TWICE A DAY   Multiple Vitamin (MULTI-VITAMINS) TABS Take 1 tablet by mouth daily.    Omega-3 Fatty Acids (OMEGA-3 FISH OIL PO) Take 1,000 mg by mouth every morning.   rosuvastatin (CRESTOR) 10 MG tablet TAKE 1 TABLET BY MOUTH EVERY DAY IN THE EVENING   Semaglutide, 1 MG/DOSE, (OZEMPIC, 1 MG/DOSE,) 4 MG/3ML SOPN INJECT 1 MG INTO THE SKIN ONE TIME PER WEEK   Testosterone 1.62 % GEL Apply 1 application topically daily.    [DISCONTINUED] amLODipine-benazepril (LOTREL) 5-20 MG capsule Take 1 capsule by mouth daily.   No facility-administered medications prior to visit.    Review of Systems  Constitutional:  Positive for weight loss (2 lbs).       Gained 2 lb weight  HENT: Negative.    Eyes: Negative.   Respiratory: Negative.    Cardiovascular: Negative.   Gastrointestinal:  Positive for constipation.  Genitourinary:  Positive for frequency.  Skin: Negative.   Neurological: Negative.   Endo/Heme/Allergies:  Positive for polydipsia.       Objective:   BP 110/78   Pulse 78   Temp 98.7 F (37.1 C)   Ht 6' (1.829 m)   Wt 282 lb (127.9 kg)   SpO2 98%   BMI 38.25 kg/m   Vitals:   10/12/23 0958  BP: 110/78  Pulse: 78  Temp: 98.7 F (37.1 C)  Height: 6' (1.829 m)  Weight: 282 lb (127.9 kg)  SpO2: 98%  BMI (Calculated): 38.24    Physical Exam Vitals reviewed.  Constitutional:      Appearance: Normal appearance. He is obese.  HENT:     Head: Normocephalic.     Left Ear: There is no impacted cerumen.     Nose: Nose normal.     Mouth/Throat:     Mouth: Mucous  membranes are moist.     Pharynx: No posterior oropharyngeal erythema.  Eyes:     Extraocular Movements: Extraocular movements intact.     Pupils: Pupils are equal, round, and reactive to light.  Cardiovascular:     Rate and Rhythm: Regular rhythm.  Chest Wall: PMI is not displaced.     Pulses: Normal pulses.     Heart sounds: Normal heart sounds. No murmur heard. Pulmonary:     Effort: Pulmonary effort is normal.     Breath sounds: Normal air entry. No rhonchi or rales.  Abdominal:     General: Abdomen is flat. Bowel sounds are normal. There is no distension.     Palpations: Abdomen is soft. There is no hepatomegaly, splenomegaly or mass.     Tenderness: There is no abdominal tenderness.  Musculoskeletal:        General: Normal range of motion.     Cervical back: Normal range of motion and neck supple.     Right lower leg: No edema.     Left lower leg: No edema.  Skin:    General: Skin is warm and dry.  Neurological:     General: No focal deficit present.     Mental Status: He is alert and oriented to person, place, and time.     Cranial Nerves: No cranial nerve deficit.     Motor: No weakness.  Psychiatric:        Mood and Affect: Mood normal.        Behavior: Behavior normal.      Results for orders placed or performed in visit on 10/12/23  POCT CBG (Fasting - Glucose)  Result Value Ref Range   Glucose Fasting, POC 100 (A) 70 - 99 mg/dL    Recent Results (from the past 2160 hours)  Lipid panel     Status: Abnormal   Collection Time: 10/08/23 12:10 PM  Result Value Ref Range   Cholesterol, Total 134 100 - 199 mg/dL   Triglycerides 161 (H) 0 - 149 mg/dL   HDL 31 (L) >09 mg/dL   VLDL Cholesterol Cal 32 5 - 40 mg/dL   LDL Chol Calc (NIH) 71 0 - 99 mg/dL   Chol/HDL Ratio 4.3 0.0 - 5.0 ratio    Comment:                                   T. Chol/HDL Ratio                                             Men  Women                               1/2 Avg.Risk  3.4     3.3                                   Avg.Risk  5.0    4.4                                2X Avg.Risk  9.6    7.1                                3X Avg.Risk 23.4   11.0   Hemoglobin A1c     Status: Abnormal   Collection Time: 10/08/23 12:10 PM  Result Value Ref Range   Hgb A1c MFr Bld 5.9 (H) 4.8 - 5.6 %    Comment:          Prediabetes: 5.7 - 6.4          Diabetes: >6.4          Glycemic control for adults with diabetes: <7.0    Est. average glucose Bld gHb Est-mCnc 123 mg/dL  POCT CBG (Fasting - Glucose)     Status: Abnormal   Collection Time: 10/12/23 10:04 AM  Result Value Ref Range   Glucose Fasting, POC 100 (A) 70 - 99 mg/dL      Assessment & Plan:  As per problem list. The current medical regimen is effective;  continue present plan and medications.  Problem List Items Addressed This Visit       Cardiovascular and Mediastinum   Essential (primary) hypertension   Relevant Medications   amLODipine-benazepril (LOTREL) 5-20 MG capsule     Endocrine   Type 2 diabetes mellitus without complication, without long-term current use of insulin (HCC) - Primary   Relevant Medications   amLODipine-benazepril (LOTREL) 5-20 MG capsule   Other Relevant Orders   POCT CBG (Fasting - Glucose) (Completed)   Other Visit Diagnoses       Colon cancer screening       Relevant Orders   Ambulatory referral to Gastroenterology       Return in about 3 months (around 01/09/2024) for fu with labs prior.   Total time spent: 20 minutes  Luna Fuse, MD  10/12/2023   This document may have been prepared by [facility] Voice Recognition software and as such may include unintentional dictation errors.

## 2023-10-23 ENCOUNTER — Emergency Department
Admission: EM | Admit: 2023-10-23 | Discharge: 2023-10-23 | Discharge status: Against Medical Advice | Payer: BC Managed Care – PPO | Attending: Emergency Medicine | Admitting: Emergency Medicine

## 2023-10-23 ENCOUNTER — Other Ambulatory Visit: Payer: Self-pay

## 2023-10-23 DIAGNOSIS — S6992XA Unspecified injury of left wrist, hand and finger(s), initial encounter: Secondary | ICD-10-CM | POA: Diagnosis present

## 2023-10-23 DIAGNOSIS — Z5321 Procedure and treatment not carried out due to patient leaving prior to being seen by health care provider: Secondary | ICD-10-CM | POA: Insufficient documentation

## 2023-10-23 DIAGNOSIS — Y990 Civilian activity done for income or pay: Secondary | ICD-10-CM | POA: Diagnosis not present

## 2023-10-23 DIAGNOSIS — W268XXA Contact with other sharp object(s), not elsewhere classified, initial encounter: Secondary | ICD-10-CM | POA: Insufficient documentation

## 2023-10-23 NOTE — ED Triage Notes (Signed)
 Patient states he cut his left index finger at work, no bleeding at time of triage; needs updated Tetanus shot.

## 2023-10-23 NOTE — ED Notes (Signed)
 Patient to First Nurse desk stating he no longer wished to wait to be seen. NAD noted. Ambulatory with steady gait out of ED.

## 2023-10-25 ENCOUNTER — Other Ambulatory Visit: Payer: Self-pay | Admitting: Internal Medicine

## 2023-10-28 ENCOUNTER — Other Ambulatory Visit: Payer: Self-pay | Admitting: Internal Medicine

## 2023-11-30 ENCOUNTER — Other Ambulatory Visit: Payer: Self-pay | Admitting: Internal Medicine

## 2023-11-30 DIAGNOSIS — E119 Type 2 diabetes mellitus without complications: Secondary | ICD-10-CM

## 2024-01-03 ENCOUNTER — Other Ambulatory Visit: Payer: Self-pay | Admitting: Internal Medicine

## 2024-01-03 DIAGNOSIS — K219 Gastro-esophageal reflux disease without esophagitis: Secondary | ICD-10-CM

## 2024-01-14 ENCOUNTER — Emergency Department
Admission: EM | Admit: 2024-01-14 | Discharge: 2024-01-14 | Disposition: A | Discharge status: Home / Self Care | Attending: Emergency Medicine | Admitting: Emergency Medicine

## 2024-01-14 ENCOUNTER — Other Ambulatory Visit: Payer: Self-pay

## 2024-01-14 ENCOUNTER — Encounter: Payer: Self-pay | Admitting: *Deleted

## 2024-01-14 DIAGNOSIS — W57XXXA Bitten or stung by nonvenomous insect and other nonvenomous arthropods, initial encounter: Secondary | ICD-10-CM | POA: Diagnosis not present

## 2024-01-14 DIAGNOSIS — S70361A Insect bite (nonvenomous), right thigh, initial encounter: Secondary | ICD-10-CM | POA: Insufficient documentation

## 2024-01-14 DIAGNOSIS — L03115 Cellulitis of right lower limb: Secondary | ICD-10-CM | POA: Insufficient documentation

## 2024-01-14 MED ORDER — DOXYCYCLINE HYCLATE 100 MG PO TABS
100.0000 mg | ORAL_TABLET | Freq: Once | ORAL | Status: AC
  Administered 2024-01-14: 100 mg via ORAL
  Filled 2024-01-14: qty 1

## 2024-01-14 MED ORDER — DOXYCYCLINE HYCLATE 100 MG PO TABS: 100.0000 mg | ORAL_TABLET | Freq: Two times a day (BID) | ORAL | 0 refills | Status: AC

## 2024-01-14 NOTE — ED Triage Notes (Signed)
 Pt took a tick off right leg 2 days   Pt has red area around bite and soreness.  No drainage.  Area warm to touch  pt alert.

## 2024-01-14 NOTE — ED Provider Notes (Signed)
 [facility] Provider Note    Event Date/Time   First MD Initiated Contact with Patient 01/14/24 0241     (approximate)   History   Tick Removal   HPI  Patrick Burns is a 63 year old male presenting to the ER for evaluation of rash. 2 days ago, patient noted a tick bite over his right thigh. The tick was not engorged and he was able to fully remove the tick including the head. Since then he has developed an area of erythema and warmth surrounding the site of his tick bite.  He brought the tick with him and alcohol and notes a white dot in his back and he is concerned that this might be a [place] tick.  He denies fevers, headaches different than his normal, rash other than the area of his tick bite.     Physical Exam   Triage Vital Signs: ED Triage Vitals [01/14/24 0215]  Encounter Vitals Group     BP 115/75     Systolic BP Percentile      Diastolic BP Percentile      Pulse Rate 79     Resp 18     Temp 97.8 F (36.6 C)     Temp Source Oral     SpO2 92 %     Weight 276 lb (125.2 kg)     Height 6' (1.829 m)     Head Circumference      Peak Flow      Pain Score 0     Pain Loc      Pain Education      Exclude from Growth Chart     Most recent vital signs: Vitals:   01/14/24 0215  BP: 115/75  Pulse: 79  Resp: 18  Temp: 97.8 F (36.6 C)  SpO2: 92%     General: Awake, interactive  CV:  Regular rate, good peripheral perfusion.  Resp:  Unlabored respirations.  Abd:  Nondistended.  Neuro:  Symmetric facial movement, fluid speech MSK:  There is a small scab over the right thigh with surrounding area of blanching erythema and warmth, no visible retained tick.  Patient does have the take in an alcohol lesion, does appear intact, white dot noted over the back of the tick, does not appear engorged.   ED Results / Procedures / Treatments   Labs (all labs ordered are listed, but only abnormal results are displayed) Labs Reviewed - No  data to display   EKG EKG independently reviewed interpreted by myself (ER attending) demonstrates:    RADIOLOGY Imaging independently reviewed and interpreted by myself demonstrates:   Formal Radiology Read:  No results found.  PROCEDURES:  Critical Care performed: No  Procedures   MEDICATIONS ORDERED IN ED: Medications  doxycycline  (VIBRA -TABS) tablet 100 mg (has no administration in time range)     IMPRESSION / MDM / ASSESSMENT AND PLAN / ED COURSE  I reviewed the triage vital signs and the nursing notes.  Differential diagnosis includes, but is not limited to, tick bite with surrounding cellulitis, lower suspicion tickborne illness based on clinical history  Patient's presentation is most consistent with acute, uncomplicated illness.  63 year old male presenting with rash at the site of recent tick removal.  Suspect likely mild cellulitis related to tick bite.  No systemic symptoms suggestive of tickborne illness, but do think it is reasonable to treat with doxycycline  for his cellulitis.  Comfortable with plan.  Strict return precautions provided.  Patient discharged  in stable condition.      FINAL CLINICAL IMPRESSION(S) / ED DIAGNOSES   Final diagnoses:  Tick bite of right thigh, initial encounter  Cellulitis of right lower extremity     Rx / DC Orders   ED Discharge Orders     None        Note:  This document was prepared using Dragon voice recognition software and may include unintentional dictation errors.   Claria Crofts, MD 01/14/24 [phone]

## 2024-01-14 NOTE — Discharge Instructions (Signed)
 Take your antibiotics as directed.  Follow with your primary care doctor for further evaluation.  Return to the ER for any new or worsening symptoms.

## 2024-01-19 ENCOUNTER — Other Ambulatory Visit: Payer: Self-pay | Admitting: Internal Medicine

## 2024-01-19 DIAGNOSIS — E119 Type 2 diabetes mellitus without complications: Secondary | ICD-10-CM

## 2024-01-22 ENCOUNTER — Ambulatory Visit: Payer: BC Managed Care – PPO | Admitting: Internal Medicine

## 2024-01-25 ENCOUNTER — Other Ambulatory Visit: Payer: Self-pay | Admitting: Internal Medicine

## 2024-01-25 ENCOUNTER — Other Ambulatory Visit

## 2024-01-26 LAB — LIPID PANEL
Chol/HDL Ratio: 4.6 ratio (ref 0.0–5.0)
Cholesterol, Total: 143 mg/dL (ref 100–199)
HDL: 31 mg/dL — ABNORMAL LOW (ref >39)
LDL Chol Calc (NIH): 76 mg/dL (ref 0–99)
Triglycerides: 212 mg/dL — ABNORMAL HIGH (ref 0–149)
VLDL Cholesterol Cal: 36 mg/dL (ref 5–40)

## 2024-01-26 LAB — HEMOGLOBIN A1C
Est. average glucose Bld gHb Est-mCnc: 114 mg/dL
Hgb A1c MFr Bld: 5.6 % (ref 4.8–5.6)

## 2024-02-01 ENCOUNTER — Ambulatory Visit: Admitting: Internal Medicine

## 2024-02-01 NOTE — Telephone Encounter (Signed)
 Spoke with patient this morning, stated he needed to have his eliquis , amlodipine  benazepril , and farxiga , because he is going out of town.

## 2024-02-15 ENCOUNTER — Ambulatory Visit: Admitting: Internal Medicine

## 2024-02-15 ENCOUNTER — Ambulatory Visit: Payer: Self-pay | Admitting: Internal Medicine

## 2024-02-15 ENCOUNTER — Encounter: Payer: Self-pay | Admitting: Internal Medicine

## 2024-02-15 VITALS — BP 115/70 | HR 92 | Temp 98.4°F | Ht 72.0 in | Wt 281.0 lb

## 2024-02-15 DIAGNOSIS — S239XXA Sprain of unspecified parts of thorax, initial encounter: Secondary | ICD-10-CM

## 2024-02-15 DIAGNOSIS — E782 Mixed hyperlipidemia: Secondary | ICD-10-CM | POA: Diagnosis not present

## 2024-02-15 DIAGNOSIS — N400 Enlarged prostate without lower urinary tract symptoms: Secondary | ICD-10-CM

## 2024-02-15 DIAGNOSIS — I4891 Unspecified atrial fibrillation: Secondary | ICD-10-CM | POA: Diagnosis not present

## 2024-02-15 DIAGNOSIS — E119 Type 2 diabetes mellitus without complications: Secondary | ICD-10-CM | POA: Diagnosis not present

## 2024-02-15 DIAGNOSIS — I10 Essential (primary) hypertension: Secondary | ICD-10-CM | POA: Diagnosis not present

## 2024-02-15 LAB — POCT CBG (FASTING - GLUCOSE)-MANUAL ENTRY: Glucose Fasting, POC: 177 mg/dL — AB (ref 70–99)

## 2024-02-15 MED ORDER — DAPAGLIFLOZIN PROPANEDIOL 10 MG PO TABS: 10.0000 mg | ORAL_TABLET | Freq: Every day | ORAL | 0 refills | Status: DC

## 2024-02-15 MED ORDER — APIXABAN 5 MG PO TABS: 5.0000 mg | ORAL_TABLET | Freq: Two times a day (BID) | ORAL | 0 refills | Status: DC

## 2024-02-15 MED ORDER — CYCLOBENZAPRINE HCL 10 MG PO TABS: 10.0000 mg | ORAL_TABLET | Freq: Three times a day (TID) | ORAL | 0 refills | Status: DC | PRN

## 2024-02-15 MED ORDER — OZEMPIC (2 MG/DOSE) 8 MG/3ML ~~LOC~~ SOPN
2.0000 mg | PEN_INJECTOR | SUBCUTANEOUS | 2 refills | Status: DC
Start: 2024-02-15 — End: 2024-05-20

## 2024-02-15 MED ORDER — AMLODIPINE BESY-BENAZEPRIL HCL 5-20 MG PO CAPS: 1.0000 | ORAL_CAPSULE | Freq: Every day | ORAL | 0 refills | Status: DC

## 2024-02-15 MED ORDER — ROSUVASTATIN CALCIUM 10 MG PO TABS
10.0000 mg | ORAL_TABLET | Freq: Every day | ORAL | 0 refills | Status: DC
Start: 2024-02-15 — End: 2024-05-12

## 2024-02-15 NOTE — Progress Notes (Signed)
 Established Patient Office Visit  Subjective:  Patient ID: Patrick Burns, male    DOB: May 22, 1961  Age: 63 y.o. MRN: [id number]  Chief Complaint  Patient presents with   Follow-up    3 month follow up    C/o upper back pain exacerbated whenever he'Zelphia Glover riding his lawnmower, here for lab review and medication refills. Labs reviewed and notable for well controlled diabetes, A1c at target, lipids at target but trigs higher. Denies any hypoglycemic episodes and home bg readings have been at target.      No other concerns at this time.   Past Medical History:  Diagnosis Date   Asthma    Blood dyscrasia    BLOOD IS THICK   Diabetes mellitus without complication (HCC)    Dyspnea on exertion    GERD (gastroesophageal reflux disease)    Headache    MIGRAINES   Hypertension    Hypogonadism male    Leaky heart valve    Metastatic cancer (HCC) 2006   Rectum --> prostate --> bladder/chemo and radiation/ [facility]/squamous cell carcinoma anal rectum area   Peripheral edema    Sleep apnea    NOT USING CPAP CURRENTLY   Umbilical hernia     Past Surgical History:  Procedure Laterality Date   COLONOSCOPY  2011   [facility]   KNEE SURGERY     TONSILLECTOMY     UMBILICAL HERNIA REPAIR N/A 05/06/2015   Procedure: HERNIA REPAIR UMBILICAL ADULT;  Surgeon: Jerlean Mood, MD;  Location: [facility] ORS;  Service: General;  Laterality: N/A;    Social History   Socioeconomic History   Marital status: Married    Spouse name: Not on file   Number of children: Not on file   Years of education: Not on file   Highest education level: Not on file  Occupational History   Not on file  Tobacco Use   Smoking status: Never   Smokeless tobacco: Current    Types: Snuff   Tobacco comments:    smokelss x 44 years (1 can every other day)  Substance and Sexual Activity   Alcohol use: Not Currently    Alcohol/week: 0.0 standard drinks of alcohol    Comment: OCC SIP MOONSHINE   Drug use: No    Sexual activity: Not on file  Other Topics Concern   Not on file  Social History Narrative   Not on file   Social Drivers of Health   Financial Resource Strain: Not on file  Food Insecurity: Not on file  Transportation Needs: Not on file  Physical Activity: Not on file  Stress: Not on file  Social Connections: Not on file  Intimate Partner Violence: Not on file    Family History  Problem Relation Age of Onset   Diabetes Father    Diabetes Brother     Allergies  Allergen Reactions   Bee Venom Anaphylaxis   Ibuprofen Anaphylaxis   Biaxin [Clarithromycin] Nausea And Vomiting   Mango Flavoring Agent (Non-Screening) Swelling    LIPS   Metformin And Related Diarrhea    Outpatient Medications Prior to Visit  Medication Sig   ACCU-CHEK GUIDE TEST test strip 1 EACH BY OTHER ROUTE AS NEEDED.   Accu-Chek Softclix Lancets lancets SMARTSIG:Topical   Acetaminophen -Caffeine  (EXCEDRIN TENSION HEADACHE PO) Take 2 tablets by mouth daily as needed.   albuterol  (VENTOLIN  HFA) 108 (90 Base) MCG/ACT inhaler Inhale 2 puffs into the lungs every 6 (six) hours as needed for wheezing or shortness  of breath.   Ascorbic Acid (VITAMIN C) 1000 MG tablet Take 1,000 mg by mouth daily.   Blood Glucose Monitoring Suppl (ACCU-CHEK GUIDE ME) w/Device KIT USE TO CHECK BLOOD SUGARS TWICE DAILY   cetirizine (ZYRTEC) 10 MG tablet Take 10 mg by mouth daily.   Cholecalciferol (VITAMIN D3 PO) Take 1,000 Units by mouth every morning.   CINNAMON PO Take 2,000 mg by mouth every morning.   Coenzyme Q10 (CO Q-10) 200 MG CAPS Take 1 capsule by mouth daily.   EPINEPHrine (EPIPEN IJ) Inject 1 Dose as directed as needed.   famotidine  (PEPCID ) 20 MG tablet TAKE 1 TABLET BY MOUTH TWICE A DAY   fluticasone (FLONASE) 50 MCG/ACT nasal spray INSTILL ONE SPRAY INTO EACH NOSTRIL ONCE A DAY   furosemide (LASIX) 20 MG tablet ONCE A DAY FOR 3 DAYS THEN DAILY AS NEEDED   KRILL OIL PO Take 1 capsule by mouth daily.   metoprolol   tartrate (LOPRESSOR ) 25 MG tablet TAKE 1 TABLET BY MOUTH TWICE A DAY   Multiple Vitamin (MULTI-VITAMINS) TABS Take 1 tablet by mouth daily.    Omega-3 Fatty Acids (OMEGA-3 FISH OIL PO) Take 1,000 mg by mouth every morning.   Testosterone 1.62 % GEL Apply 1 application topically daily.    [DISCONTINUED] amLODipine -benazepril  (LOTREL) 5-20 MG capsule Take 1 capsule by mouth daily.   [DISCONTINUED] dapagliflozin  propanediol (FARXIGA ) 10 MG TABS tablet TAKE 1 TABLET BY MOUTH EVERY DAY   [DISCONTINUED] ELIQUIS  5 MG TABS tablet TAKE 1 TABLET BY MOUTH TWICE A DAY   [DISCONTINUED] rosuvastatin (CRESTOR) 10 MG tablet TAKE 1 TABLET BY MOUTH EVERY DAY IN THE EVENING   [DISCONTINUED] Semaglutide , 1 MG/DOSE, (OZEMPIC , 1 MG/DOSE,) 4 MG/3ML SOPN INJECT 1 MG INTO THE SKIN ONE TIME PER WEEK   benzonatate  (TESSALON  PERLES) 100 MG capsule Take 1 capsule (100 mg total) by mouth 3 (three) times daily as needed for cough.   No facility-administered medications prior to visit.    Review of Systems  Constitutional:  Negative for weight loss (gained 5 lbs).       Gained 2 lb weight  HENT: Negative.    Eyes: Negative.   Respiratory: Negative.    Cardiovascular: Negative.   Gastrointestinal:  Positive for constipation.  Genitourinary:  Positive for frequency.  Musculoskeletal:  Positive for back pain.  Skin: Negative.   Neurological: Negative.   Endo/Heme/Allergies:  Positive for polydipsia.       Objective:   BP 115/70   Pulse 92   Temp 98.4 F (36.9 C)   Ht 6' (1.829 m)   Wt 281 lb (127.5 kg)   SpO2 95%   BMI 38.11 kg/m   Vitals:   02/15/24 1439  BP: 115/70  Pulse: 92  Temp: 98.4 F (36.9 C)  Height: 6' (1.829 m)  Weight: 281 lb (127.5 kg)  SpO2: 95%  BMI (Calculated): 38.1    Physical Exam Vitals reviewed.  Constitutional:      Appearance: Normal appearance. He is obese.  HENT:     Head: Normocephalic.     Left Ear: There is no impacted cerumen.     Nose: Nose normal.      Mouth/Throat:     Mouth: Mucous membranes are moist.     Pharynx: No posterior oropharyngeal erythema.   Eyes:     Extraocular Movements: Extraocular movements intact.     Pupils: Pupils are equal, round, and reactive to light.    Cardiovascular:     Rate and Rhythm:  Regular rhythm.     Chest Wall: PMI is not displaced.     Pulses: Normal pulses.     Heart sounds: Normal heart sounds. No murmur heard. Pulmonary:     Effort: Pulmonary effort is normal.     Breath sounds: Normal air entry. No rhonchi or rales.  Abdominal:     General: Abdomen is flat. Bowel sounds are normal. There is no distension.     Palpations: Abdomen is soft. There is no hepatomegaly, splenomegaly or mass.     Tenderness: There is no abdominal tenderness.   Musculoskeletal:        General: Normal range of motion.     Cervical back: Normal range of motion and neck supple.     Thoracic back: Tenderness (left paraspinal muscle) present.     Right lower leg: No edema.     Left lower leg: No edema.   Skin:    General: Skin is warm and dry.   Neurological:     General: No focal deficit present.     Mental Status: He is alert and oriented to person, place, and time.     Cranial Nerves: No cranial nerve deficit.     Motor: No weakness.   Psychiatric:        Mood and Affect: Mood normal.        Behavior: Behavior normal.      Results for orders placed or performed in visit on 02/15/24  POCT CBG (Fasting - Glucose)  Result Value Ref Range   Glucose Fasting, POC 177 (A) 70 - 99 mg/dL    Recent Results (from the past 2160 hours)  Lipid panel     Status: Abnormal   Collection Time: 01/25/24 11:17 AM  Result Value Ref Range   Cholesterol, Total 143 100 - 199 mg/dL   Triglycerides 096 (H) 0 - 149 mg/dL   HDL 31 (L) >04 mg/dL   VLDL Cholesterol Cal 36 5 - 40 mg/dL   LDL Chol Calc (NIH) 76 0 - 99 mg/dL   Chol/HDL Ratio 4.6 0.0 - 5.0 ratio    Comment:                                   T. Chol/HDL  Ratio                                             Men  Women                               1/2 Avg.Risk  3.4    3.3                                   Avg.Risk  5.0    4.4                                2X Avg.Risk  9.6    7.1                                3X Avg.Risk 23.4  11.0   Hemoglobin A1c     Status: None   Collection Time: 01/25/24 11:17 AM  Result Value Ref Range   Hgb A1c MFr Bld 5.6 4.8 - 5.6 %    Comment:          Prediabetes: 5.7 - 6.4          Diabetes: >6.4          Glycemic control for adults with diabetes: <7.0    Est. average glucose Bld gHb Est-mCnc 114 mg/dL  POCT CBG (Fasting - Glucose)     Status: Abnormal   Collection Time: 02/15/24  2:45 PM  Result Value Ref Range   Glucose Fasting, POC 177 (A) 70 - 99 mg/dL      Assessment & Plan:  As per problem list. Increase GLP-1 for weight loss.  Problem List Items Addressed This Visit       Cardiovascular and Mediastinum   Essential (primary) hypertension   Relevant Medications   amLODipine -benazepril  (LOTREL) 5-20 MG capsule   apixaban  (ELIQUIS ) 5 MG TABS tablet   rosuvastatin (CRESTOR) 10 MG tablet   Other Relevant Orders   Comprehensive metabolic panel with GFR   New onset atrial fibrillation with RVR(HCC)   Relevant Medications   amLODipine -benazepril  (LOTREL) 5-20 MG capsule   apixaban  (ELIQUIS ) 5 MG TABS tablet   rosuvastatin (CRESTOR) 10 MG tablet     Endocrine   Type 2 diabetes mellitus without complication, without long-term current use of insulin  (HCC) - Primary   Relevant Medications   Semaglutide , 2 MG/DOSE, (OZEMPIC , 2 MG/DOSE,) 8 MG/3ML SOPN   amLODipine -benazepril  (LOTREL) 5-20 MG capsule   dapagliflozin  propanediol (FARXIGA ) 10 MG TABS tablet   rosuvastatin (CRESTOR) 10 MG tablet   Other Relevant Orders   POCT CBG (Fasting - Glucose) (Completed)     Other   Hyperlipidemia   Relevant Medications   amLODipine -benazepril  (LOTREL) 5-20 MG capsule   apixaban  (ELIQUIS ) 5 MG TABS tablet    rosuvastatin (CRESTOR) 10 MG tablet   Other Visit Diagnoses       Type 2 diabetes mellitus without complications (HCC)       Relevant Medications   Semaglutide , 2 MG/DOSE, (OZEMPIC , 2 MG/DOSE,) 8 MG/3ML SOPN   amLODipine -benazepril  (LOTREL) 5-20 MG capsule   dapagliflozin  propanediol (FARXIGA ) 10 MG TABS tablet   rosuvastatin (CRESTOR) 10 MG tablet   Other Relevant Orders   POCT CBG (Fasting - Glucose) (Completed)     Benign prostatic hyperplasia without lower urinary tract symptoms       Relevant Orders   PSA     Thoracic back sprain, initial encounter       Relevant Medications   cyclobenzaprine (FLEXERIL) 10 MG tablet       Return in about 3 months (around 05/17/2024) for cpe with labs prior.   Total time spent: 20 minutes  Arzella Bitters, MD  02/15/2024   This document may have been prepared by [facility] Voice Recognition software and as such may include unintentional dictation errors.

## 2024-02-19 ENCOUNTER — Other Ambulatory Visit: Payer: Self-pay | Admitting: Internal Medicine

## 2024-02-19 DIAGNOSIS — I10 Essential (primary) hypertension: Secondary | ICD-10-CM

## 2024-02-25 ENCOUNTER — Other Ambulatory Visit: Payer: Self-pay | Admitting: Internal Medicine

## 2024-02-25 DIAGNOSIS — K219 Gastro-esophageal reflux disease without esophagitis: Secondary | ICD-10-CM

## 2024-03-13 ENCOUNTER — Other Ambulatory Visit: Payer: Self-pay | Admitting: Internal Medicine

## 2024-03-13 DIAGNOSIS — S239XXA Sprain of unspecified parts of thorax, initial encounter: Secondary | ICD-10-CM

## 2024-04-12 ENCOUNTER — Other Ambulatory Visit: Payer: Self-pay | Admitting: Internal Medicine

## 2024-04-12 DIAGNOSIS — S239XXA Sprain of unspecified parts of thorax, initial encounter: Secondary | ICD-10-CM

## 2024-04-16 ENCOUNTER — Other Ambulatory Visit: Payer: Self-pay | Admitting: Internal Medicine

## 2024-04-16 DIAGNOSIS — S239XXA Sprain of unspecified parts of thorax, initial encounter: Secondary | ICD-10-CM

## 2024-04-21 ENCOUNTER — Encounter: Payer: Self-pay | Admitting: Internal Medicine

## 2024-05-12 ENCOUNTER — Other Ambulatory Visit: Payer: Self-pay | Admitting: Internal Medicine

## 2024-05-12 DIAGNOSIS — I10 Essential (primary) hypertension: Secondary | ICD-10-CM

## 2024-05-12 DIAGNOSIS — E782 Mixed hyperlipidemia: Secondary | ICD-10-CM

## 2024-05-14 ENCOUNTER — Other Ambulatory Visit: Payer: Self-pay | Admitting: Internal Medicine

## 2024-05-14 DIAGNOSIS — S239XXA Sprain of unspecified parts of thorax, initial encounter: Secondary | ICD-10-CM

## 2024-05-20 ENCOUNTER — Other Ambulatory Visit: Payer: Self-pay | Admitting: Internal Medicine

## 2024-05-20 DIAGNOSIS — E119 Type 2 diabetes mellitus without complications: Secondary | ICD-10-CM

## 2024-05-23 ENCOUNTER — Encounter: Admitting: Internal Medicine

## 2024-05-29 ENCOUNTER — Other Ambulatory Visit: Payer: Self-pay | Admitting: Internal Medicine

## 2024-05-29 DIAGNOSIS — S239XXA Sprain of unspecified parts of thorax, initial encounter: Secondary | ICD-10-CM

## 2024-05-29 DIAGNOSIS — E119 Type 2 diabetes mellitus without complications: Secondary | ICD-10-CM

## 2024-06-10 ENCOUNTER — Other Ambulatory Visit

## 2024-06-11 LAB — LIPID PANEL
Chol/HDL Ratio: 3.6 ratio (ref 0.0–5.0)
Cholesterol, Total: 124 mg/dL (ref 100–199)
HDL: 34 mg/dL — ABNORMAL LOW (ref >39)
LDL Chol Calc (NIH): 66 mg/dL (ref 0–99)
Triglycerides: 136 mg/dL (ref 0–149)
VLDL Cholesterol Cal: 24 mg/dL (ref 5–40)

## 2024-06-11 LAB — COMPREHENSIVE METABOLIC PANEL WITH GFR
ALT: 21 IU/L (ref 0–44)
AST: 19 IU/L (ref 0–40)
Albumin: 4.6 g/dL (ref 3.9–4.9)
Alkaline Phosphatase: 56 IU/L (ref 47–123)
BUN/Creatinine Ratio: 16 (ref 10–24)
BUN: 14 mg/dL (ref 8–27)
Bilirubin Total: 0.6 mg/dL (ref 0.0–1.2)
CO2: 21 mmol/L (ref 20–29)
Calcium: 9.8 mg/dL (ref 8.6–10.2)
Chloride: 103 mmol/L (ref 96–106)
Creatinine, Ser: 0.85 mg/dL (ref 0.76–1.27)
Globulin, Total: 2 g/dL (ref 1.5–4.5)
Glucose: 84 mg/dL (ref 70–99)
Potassium: 4.2 mmol/L (ref 3.5–5.2)
Sodium: 141 mmol/L (ref 134–144)
Total Protein: 6.6 g/dL (ref 6.0–8.5)
eGFR: 98 mL/min/1.73 (ref >59)

## 2024-06-11 LAB — HEMOGLOBIN A1C
Est. average glucose Bld gHb Est-mCnc: 114 mg/dL
Hgb A1c MFr Bld: 5.6 % (ref 4.8–5.6)

## 2024-06-11 LAB — PSA: Prostate Specific Ag, Serum: <0.1 ng/mL (ref 0.0–4.0)

## 2024-06-16 ENCOUNTER — Encounter: Admitting: Internal Medicine

## 2024-06-23 ENCOUNTER — Ambulatory Visit: Payer: Self-pay | Admitting: Internal Medicine

## 2024-06-23 ENCOUNTER — Ambulatory Visit (INDEPENDENT_AMBULATORY_CARE_PROVIDER_SITE_OTHER): Admitting: Internal Medicine

## 2024-06-23 ENCOUNTER — Encounter: Payer: Self-pay | Admitting: Internal Medicine

## 2024-06-23 VITALS — BP 110/78 | HR 76 | Temp 97.0°F | Ht 72.0 in | Wt 265.0 lb

## 2024-06-23 DIAGNOSIS — E119 Type 2 diabetes mellitus without complications: Secondary | ICD-10-CM

## 2024-06-23 DIAGNOSIS — Z0001 Encounter for general adult medical examination with abnormal findings: Secondary | ICD-10-CM | POA: Diagnosis not present

## 2024-06-23 DIAGNOSIS — I4891 Unspecified atrial fibrillation: Secondary | ICD-10-CM

## 2024-06-23 DIAGNOSIS — E1165 Type 2 diabetes mellitus with hyperglycemia: Secondary | ICD-10-CM | POA: Diagnosis not present

## 2024-06-23 DIAGNOSIS — E782 Mixed hyperlipidemia: Secondary | ICD-10-CM | POA: Diagnosis not present

## 2024-06-23 DIAGNOSIS — Z23 Encounter for immunization: Secondary | ICD-10-CM | POA: Diagnosis not present

## 2024-06-23 DIAGNOSIS — Z1331 Encounter for screening for depression: Secondary | ICD-10-CM | POA: Diagnosis not present

## 2024-06-23 DIAGNOSIS — Z0130 Encounter for examination of blood pressure without abnormal findings: Secondary | ICD-10-CM

## 2024-06-23 LAB — POCT CBG (FASTING - GLUCOSE)-MANUAL ENTRY: Glucose Fasting, POC: 105 mg/dL — AB (ref 70–99)

## 2024-06-23 MED ORDER — APIXABAN 5 MG PO TABS: 5.0000 mg | ORAL_TABLET | Freq: Two times a day (BID) | ORAL | 0 refills | Status: AC

## 2024-06-23 MED ORDER — ROSUVASTATIN CALCIUM 10 MG PO TABS: 10.0000 mg | ORAL_TABLET | Freq: Every day | ORAL | 0 refills | Status: AC

## 2024-06-23 NOTE — Progress Notes (Signed)
 Established Patient Office Visit  Subjective:  Patient ID: Patrick Burns, male    DOB: Oct 18, 1960  Age: 63 y.o. MRN: [id number]  Chief Complaint  Patient presents with   Annual Exam    CPE and lab results     Here for CPE, lab review and medication refills. No new complaints, here for lab review and medication refills. Labs reviewed and notable for well controlled/uncontrolled diabetes, A1c not / at target, lipids at target with unremarkable cmp. Denies any hypoglycemic episodes and home bg readings have been at target.      No other concerns at this time.   Past Medical History:  Diagnosis Date   Asthma    Blood dyscrasia    BLOOD IS THICK   Diabetes mellitus without complication (HCC)    Dyspnea on exertion    GERD (gastroesophageal reflux disease)    Headache    MIGRAINES   Hypertension    Hypogonadism male    Leaky heart valve    Metastatic cancer (HCC) 2006   Rectum --> prostate --> bladder/chemo and radiation/ [facility]/squamous cell carcinoma anal rectum area   Peripheral edema    Sleep apnea    NOT USING CPAP CURRENTLY   Umbilical hernia     Past Surgical History:  Procedure Laterality Date   COLONOSCOPY  2011   [facility]   KNEE SURGERY     TONSILLECTOMY     UMBILICAL HERNIA REPAIR N/A 05/06/2015   Procedure: HERNIA REPAIR UMBILICAL ADULT;  Surgeon: Louanne KANDICE Muse, MD;  Location: [facility] ORS;  Service: General;  Laterality: N/A;    Social History   Socioeconomic History   Marital status: Married    Spouse name: Not on file   Number of children: Not on file   Years of education: Not on file   Highest education level: Not on file  Occupational History   Not on file  Tobacco Use   Smoking status: Never   Smokeless tobacco: Current    Types: Snuff   Tobacco comments:    smokelss x 44 years (1 can every other day)  Substance and Sexual Activity   Alcohol use: Not Currently    Alcohol/week: 0.0 standard drinks of alcohol    Comment: OCC SIP  MOONSHINE   Drug use: No   Sexual activity: Not Currently  Other Topics Concern   Not on file  Social History Narrative   Not on file   Social Drivers of Health   Financial Resource Strain: Not on file  Food Insecurity: Not on file  Transportation Needs: Not on file  Physical Activity: Not on file  Stress: Not on file  Social Connections: Not on file  Intimate Partner Violence: Not on file    Family History  Problem Relation Age of Onset   Diabetes Father    Diabetes Brother     Allergies  Allergen Reactions   Bee Venom Anaphylaxis   Ibuprofen Anaphylaxis   Biaxin [Clarithromycin] Nausea And Vomiting   Mango Flavoring Agent (Non-Screening) Swelling    LIPS   Metformin And Related Diarrhea    Outpatient Medications Prior to Visit  Medication Sig   ACCU-CHEK GUIDE TEST test strip 1 EACH BY OTHER ROUTE AS NEEDED.   Accu-Chek Softclix Lancets lancets SMARTSIG:Topical   Acetaminophen -Caffeine  (EXCEDRIN TENSION HEADACHE PO) Take 2 tablets by mouth daily as needed.   albuterol  (VENTOLIN  HFA) 108 (90 Base) MCG/ACT inhaler Inhale 2 puffs into the lungs every 6 (six) hours as needed  for wheezing or shortness of breath.   amLODipine -benazepril  (LOTREL) 5-20 MG capsule TAKE 1 CAPSULE BY MOUTH EVERY DAY   Ascorbic Acid (VITAMIN C) 1000 MG tablet Take 1,000 mg by mouth daily.   Blood Glucose Monitoring Suppl (ACCU-CHEK GUIDE ME) w/Device KIT USE TO CHECK BLOOD SUGARS TWICE DAILY   cetirizine (ZYRTEC) 10 MG tablet Take 10 mg by mouth daily.   Cholecalciferol (VITAMIN D3 PO) Take 1,000 Units by mouth every morning.   CINNAMON PO Take 2,000 mg by mouth every morning.   cyclobenzaprine  (FLEXERIL ) 10 MG tablet TAKE 1 TABLET BY MOUTH THREE TIMES A DAY AS NEEDED FOR MUSCLE SPASM   famotidine  (PEPCID ) 20 MG tablet TAKE 1 TABLET BY MOUTH TWICE A DAY   FARXIGA  10 MG TABS tablet TAKE 1 TABLET BY MOUTH EVERY DAY   fluticasone (FLONASE) 50 MCG/ACT nasal spray INSTILL ONE SPRAY INTO EACH  NOSTRIL ONCE A DAY   KRILL OIL PO Take 1 capsule by mouth daily.   metoprolol  tartrate (LOPRESSOR ) 25 MG tablet TAKE 1 TABLET BY MOUTH TWICE A DAY   Multiple Vitamin (MULTI-VITAMINS) TABS Take 1 tablet by mouth daily.    Omega-3 Fatty Acids (OMEGA-3 FISH OIL PO) Take 1,000 mg by mouth every morning.   Semaglutide , 2 MG/DOSE, (OZEMPIC , 2 MG/DOSE,) 8 MG/3ML SOPN INJECT 2 MG INTO THE SKIN ONCE A WEEK.   [DISCONTINUED] apixaban  (ELIQUIS ) 5 MG TABS tablet Take 1 tablet (5 mg total) by mouth 2 (two) times daily.   [DISCONTINUED] rosuvastatin  (CRESTOR ) 10 MG tablet TAKE 1 TABLET BY MOUTH EVERY DAY   Coenzyme Q10 (CO Q-10) 200 MG CAPS Take 1 capsule by mouth daily.   EPINEPHrine (EPIPEN IJ) Inject 1 Dose as directed as needed. (Patient not taking: Reported on 06/23/2024)   furosemide (LASIX) 20 MG tablet ONCE A DAY FOR 3 DAYS THEN DAILY AS NEEDED (Patient not taking: Reported on 06/23/2024)   Testosterone 1.62 % GEL Apply 1 application topically daily.  (Patient not taking: Reported on 06/23/2024)   No facility-administered medications prior to visit.    Review of Systems  Constitutional:  Positive for weight loss (15 lbs).       Gained 2 lb weight  HENT: Negative.    Eyes: Negative.   Respiratory: Negative.    Cardiovascular: Negative.   Gastrointestinal:  Positive for constipation.  Genitourinary:  Positive for frequency.  Musculoskeletal:  Positive for back pain.  Skin: Negative.   Neurological: Negative.   Endo/Heme/Allergies:  Positive for polydipsia.       Objective:   BP 110/78   Pulse 76   Temp (!) 97 F (36.1 C)   Ht 6' (1.829 m)   Wt 265 lb (120.2 kg)   SpO2 96%   BMI 35.94 kg/m   Vitals:   06/23/24 1402  BP: 110/78  Pulse: 76  Temp: (!) 97 F (36.1 C)  Height: 6' (1.829 m)  Weight: 265 lb (120.2 kg)  SpO2: 96%  BMI (Calculated): 35.93    Physical Exam Vitals reviewed.  Constitutional:      Appearance: Normal appearance. He is obese.  HENT:     Head:  Normocephalic.     Left Ear: There is no impacted cerumen.     Nose: Nose normal.     Mouth/Throat:     Mouth: Mucous membranes are moist.     Pharynx: No posterior oropharyngeal erythema.  Eyes:     Extraocular Movements: Extraocular movements intact.     Pupils: Pupils are equal,  round, and reactive to light.  Cardiovascular:     Rate and Rhythm: Regular rhythm.     Chest Wall: PMI is not displaced.     Pulses: Normal pulses.     Heart sounds: Normal heart sounds. No murmur heard. Pulmonary:     Effort: Pulmonary effort is normal.     Breath sounds: Normal air entry. No rhonchi or rales.  Abdominal:     General: Abdomen is flat. Bowel sounds are normal. There is no distension.     Palpations: Abdomen is soft. There is no hepatomegaly, splenomegaly or mass.     Tenderness: There is no abdominal tenderness.  Musculoskeletal:        General: Normal range of motion.     Cervical back: Normal range of motion and neck supple.     Thoracic back: Tenderness (left paraspinal muscle) present.     Right lower leg: No edema.     Left lower leg: No edema.  Skin:    General: Skin is warm and dry.  Neurological:     General: No focal deficit present.     Mental Status: He is alert and oriented to person, place, and time.     Cranial Nerves: No cranial nerve deficit.     Motor: No weakness.  Psychiatric:        Mood and Affect: Mood normal.        Behavior: Behavior normal.      Results for orders placed or performed in visit on 06/23/24  POCT CBG (Fasting - Glucose)  Result Value Ref Range   Glucose Fasting, POC 105 (A) 70 - 99 mg/dL    Recent Results (from the past 2160 hours)  Lipid panel     Status: Abnormal   Collection Time: 06/10/24 12:20 PM  Result Value Ref Range   Cholesterol, Total 124 100 - 199 mg/dL   Triglycerides 863 0 - 149 mg/dL   HDL 34 (L) >60 mg/dL   VLDL Cholesterol Cal 24 5 - 40 mg/dL   LDL Chol Calc (NIH) 66 0 - 99 mg/dL   Chol/HDL Ratio 3.6 0.0 - 5.0  ratio    Comment:                                   T. Chol/HDL Ratio                                             Men  Women                               1/2 Avg.Risk  3.4    3.3                                   Avg.Risk  5.0    4.4                                2X Avg.Risk  9.6    7.1  3X Avg.Risk 23.4   11.0   Hemoglobin A1c     Status: None   Collection Time: 06/10/24 12:23 PM  Result Value Ref Range   Hgb A1c MFr Bld 5.6 4.8 - 5.6 %    Comment:          Prediabetes: 5.7 - 6.4          Diabetes: >6.4          Glycemic control for adults with diabetes: <7.0    Est. average glucose Bld gHb Est-mCnc 114 mg/dL  Comprehensive metabolic panel with GFR     Status: None   Collection Time: 06/10/24 12:23 PM  Result Value Ref Range   Glucose 84 70 - 99 mg/dL   BUN 14 8 - 27 mg/dL   Creatinine, Ser 9.14 0.76 - 1.27 mg/dL   eGFR 98 >40 fO/fpw/8.26   BUN/Creatinine Ratio 16 10 - 24   Sodium 141 134 - 144 mmol/L   Potassium 4.2 3.5 - 5.2 mmol/L   Chloride 103 96 - 106 mmol/L   CO2 21 20 - 29 mmol/L   Calcium  9.8 8.6 - 10.2 mg/dL   Total Protein 6.6 6.0 - 8.5 g/dL   Albumin 4.6 3.9 - 4.9 g/dL   Globulin, Total 2.0 1.5 - 4.5 g/dL   Bilirubin Total 0.6 0.0 - 1.2 mg/dL   Alkaline Phosphatase 56 47 - 123 IU/L   AST 19 0 - 40 IU/L   ALT 21 0 - 44 IU/L  PSA     Status: None   Collection Time: 06/10/24 12:23 PM  Result Value Ref Range   Prostate Specific Ag, Serum <0.1 0.0 - 4.0 ng/mL    Comment: Roche ECLIA methodology. According to the American Urological Association, Serum PSA should decrease and remain at undetectable levels after radical prostatectomy. The AUA defines biochemical recurrence as an initial PSA value 0.2 ng/mL or greater followed by a subsequent confirmatory PSA value 0.2 ng/mL or greater. Values obtained with different assay methods or kits cannot be used interchangeably. Results cannot be interpreted as absolute evidence of the  presence or absence of malignant disease.   POCT CBG (Fasting - Glucose)     Status: Abnormal   Collection Time: 06/23/24  2:13 PM  Result Value Ref Range   Glucose Fasting, POC 105 (A) 70 - 99 mg/dL      Assessment & Plan:  Davone was seen today for annual exam.  Annual visit for general adult medical examination with abnormal findings -     Hepatitis C antibody -     HIV Antibody (routine testing w rflx)  Type 2 diabetes mellitus without complications (HCC)  New onset atrial fibrillation (HCC) -     Apixaban ; Take 1 tablet (5 mg total) by mouth 2 (two) times daily.  Dispense: 180 tablet; Refill: 0  Mixed hyperlipidemia -     Rosuvastatin  Calcium ; Take 1 tablet (10 mg total) by mouth daily.  Dispense: 90 tablet; Refill: 0  Type 2 diabetes mellitus without complication, without long-term current use of insulin  (HCC) -     POCT CBG (Fasting - Glucose)  Encounter for general adult medical examination with abnormal findings    Problem List Items Addressed This Visit       Cardiovascular and Mediastinum   New onset atrial fibrillation with RVR(HCC)   Relevant Medications   apixaban  (ELIQUIS ) 5 MG TABS tablet   rosuvastatin  (CRESTOR ) 10 MG tablet     Endocrine   Type 2  diabetes mellitus without complication, without long-term current use of insulin  (HCC)   Relevant Medications   rosuvastatin  (CRESTOR ) 10 MG tablet   Other Relevant Orders   POCT CBG (Fasting - Glucose) (Completed)     Other   Hyperlipidemia   Relevant Medications   apixaban  (ELIQUIS ) 5 MG TABS tablet   rosuvastatin  (CRESTOR ) 10 MG tablet   Other Visit Diagnoses       Annual visit for general adult medical examination with abnormal findings    -  Primary   Relevant Orders   Hepatitis C Antibody   HIV antibody (with reflex)     Type 2 diabetes mellitus without complications (HCC)       Relevant Medications   rosuvastatin  (CRESTOR ) 10 MG tablet   Other Relevant Orders   POCT CBG (Fasting -  Glucose) (Completed)     Encounter for general adult medical examination with abnormal findings           Return in 3 months (on 09/23/2024) for fu with labs prior.   Total time spent: 30 minutes. This time includes review of previous notes and results and patient face to face interaction during today'Zephyra Bernardi visit.    Sherrill Cinderella Perry, MD  06/23/2024   This document may have been prepared by [facility] Voice Recognition software and as such may include unintentional dictation errors.

## 2024-07-05 ENCOUNTER — Other Ambulatory Visit: Payer: Self-pay | Admitting: Internal Medicine

## 2024-07-05 DIAGNOSIS — K219 Gastro-esophageal reflux disease without esophagitis: Secondary | ICD-10-CM

## 2024-07-16 ENCOUNTER — Other Ambulatory Visit: Payer: Self-pay | Admitting: Internal Medicine

## 2024-07-16 DIAGNOSIS — S239XXA Sprain of unspecified parts of thorax, initial encounter: Secondary | ICD-10-CM

## 2024-07-27 ENCOUNTER — Other Ambulatory Visit: Payer: Self-pay | Admitting: Cardiovascular Disease

## 2024-08-15 ENCOUNTER — Other Ambulatory Visit: Payer: Self-pay | Admitting: Internal Medicine

## 2024-08-15 DIAGNOSIS — S239XXA Sprain of unspecified parts of thorax, initial encounter: Secondary | ICD-10-CM

## 2024-09-06 ENCOUNTER — Other Ambulatory Visit: Payer: Self-pay | Admitting: Internal Medicine

## 2024-09-06 DIAGNOSIS — E119 Type 2 diabetes mellitus without complications: Secondary | ICD-10-CM

## 2024-09-12 ENCOUNTER — Other Ambulatory Visit

## 2024-09-12 DIAGNOSIS — E119 Type 2 diabetes mellitus without complications: Secondary | ICD-10-CM

## 2024-09-12 DIAGNOSIS — N400 Enlarged prostate without lower urinary tract symptoms: Secondary | ICD-10-CM

## 2024-09-12 DIAGNOSIS — I10 Essential (primary) hypertension: Secondary | ICD-10-CM

## 2024-09-13 LAB — COMPREHENSIVE METABOLIC PANEL WITH GFR
ALT: 17 IU/L (ref 0–44)
AST: 22 IU/L (ref 0–40)
Albumin: 4.3 g/dL (ref 3.9–4.9)
Alkaline Phosphatase: 51 IU/L (ref 47–123)
BUN/Creatinine Ratio: 18 (ref 10–24)
BUN: 16 mg/dL (ref 8–27)
Bilirubin Total: 0.8 mg/dL (ref 0.0–1.2)
CO2: 23 mmol/L (ref 20–29)
Calcium: 9.8 mg/dL (ref 8.6–10.2)
Chloride: 101 mmol/L (ref 96–106)
Creatinine, Ser: 0.88 mg/dL (ref 0.76–1.27)
Globulin, Total: 2.5 g/dL (ref 1.5–4.5)
Glucose: 84 mg/dL (ref 70–99)
Potassium: 4.5 mmol/L (ref 3.5–5.2)
Sodium: 140 mmol/L (ref 134–144)
Total Protein: 6.8 g/dL (ref 6.0–8.5)
eGFR: 97 mL/min/1.73

## 2024-09-13 LAB — PSA: Prostate Specific Ag, Serum: <0.1 ng/mL (ref 0.0–4.0)

## 2024-09-23 ENCOUNTER — Ambulatory Visit: Admitting: Internal Medicine

## 2024-10-01 ENCOUNTER — Other Ambulatory Visit: Payer: Self-pay

## 2024-10-01 DIAGNOSIS — E119 Type 2 diabetes mellitus without complications: Secondary | ICD-10-CM

## 2024-10-01 MED ORDER — DAPAGLIFLOZIN PROPANEDIOL 10 MG PO TABS: 10.0000 mg | ORAL_TABLET | Freq: Every day | ORAL | 0 refills | Status: AC

## 2024-10-03 ENCOUNTER — Other Ambulatory Visit: Payer: Self-pay

## 2024-10-03 ENCOUNTER — Telehealth: Payer: Self-pay

## 2024-10-03 DIAGNOSIS — E119 Type 2 diabetes mellitus without complications: Secondary | ICD-10-CM

## 2024-10-03 NOTE — Telephone Encounter (Signed)
 Error

## 2024-10-07 ENCOUNTER — Ambulatory Visit: Admitting: Internal Medicine

## 2024-10-10 ENCOUNTER — Ambulatory Visit: Admit: 2024-10-10 | Admitting: Gastroenterology
